# Patient Record
Sex: Female | Born: 1945 | Race: White | Hispanic: No | Smoking: Never smoker
Health system: Southern US, Community
[De-identification: ages and names within clinical notes are randomized; demographics above are authoritative.]

## PROBLEM LIST (undated history)

## (undated) DIAGNOSIS — R7303 Prediabetes: Secondary | ICD-10-CM

## (undated) DIAGNOSIS — M1990 Unspecified osteoarthritis, unspecified site: Secondary | ICD-10-CM

## (undated) DIAGNOSIS — Z22322 Carrier or suspected carrier of Methicillin resistant Staphylococcus aureus: Secondary | ICD-10-CM

## (undated) DIAGNOSIS — I10 Essential (primary) hypertension: Secondary | ICD-10-CM

## (undated) HISTORY — PX: OTHER SURGICAL HISTORY: SHX169

## (undated) HISTORY — PX: ABDOMINAL HYSTERECTOMY: SHX81

## (undated) HISTORY — PX: EYE SURGERY: SHX253

## (undated) HISTORY — PX: CARPAL TUNNEL RELEASE: SHX101

## (undated) HISTORY — PX: APPENDECTOMY: SHX54

---

## 1997-12-12 ENCOUNTER — Ambulatory Visit (HOSPITAL_COMMUNITY): Admission: RE | Admit: 1997-12-12 | Discharge: 1997-12-12 | Payer: Self-pay | Admitting: Family Medicine

## 1998-07-05 ENCOUNTER — Other Ambulatory Visit: Admission: RE | Admit: 1998-07-05 | Discharge: 1998-07-05 | Payer: Self-pay | Admitting: Family Medicine

## 1999-08-13 ENCOUNTER — Other Ambulatory Visit: Admission: RE | Admit: 1999-08-13 | Discharge: 1999-08-13 | Payer: Self-pay | Admitting: Family Medicine

## 2002-04-14 ENCOUNTER — Encounter: Payer: Self-pay | Admitting: Orthopedic Surgery

## 2002-04-21 ENCOUNTER — Encounter: Payer: Self-pay | Admitting: Orthopedic Surgery

## 2002-04-21 ENCOUNTER — Inpatient Hospital Stay (HOSPITAL_COMMUNITY): Admission: RE | Admit: 2002-04-21 | Discharge: 2002-04-26 | Payer: Self-pay | Admitting: Orthopedic Surgery

## 2003-02-07 ENCOUNTER — Encounter: Payer: Self-pay | Admitting: Orthopedic Surgery

## 2003-02-15 ENCOUNTER — Inpatient Hospital Stay (HOSPITAL_COMMUNITY): Admission: RE | Admit: 2003-02-15 | Discharge: 2003-02-20 | Payer: Self-pay | Admitting: Orthopedic Surgery

## 2003-02-15 ENCOUNTER — Encounter: Payer: Self-pay | Admitting: Orthopedic Surgery

## 2003-02-17 ENCOUNTER — Encounter: Payer: Self-pay | Admitting: Orthopedic Surgery

## 2003-02-18 ENCOUNTER — Encounter: Payer: Self-pay | Admitting: Internal Medicine

## 2012-01-02 ENCOUNTER — Ambulatory Visit: Payer: Self-pay

## 2013-10-05 DIAGNOSIS — N390 Urinary tract infection, site not specified: Secondary | ICD-10-CM | POA: Diagnosis not present

## 2013-10-08 DIAGNOSIS — N390 Urinary tract infection, site not specified: Secondary | ICD-10-CM | POA: Diagnosis not present

## 2013-10-11 DIAGNOSIS — M545 Low back pain: Secondary | ICD-10-CM | POA: Diagnosis not present

## 2013-10-20 DIAGNOSIS — M545 Low back pain: Secondary | ICD-10-CM | POA: Diagnosis not present

## 2013-10-25 DIAGNOSIS — M549 Dorsalgia, unspecified: Secondary | ICD-10-CM | POA: Diagnosis not present

## 2013-10-25 DIAGNOSIS — M1990 Unspecified osteoarthritis, unspecified site: Secondary | ICD-10-CM | POA: Diagnosis not present

## 2013-11-09 DIAGNOSIS — H26499 Other secondary cataract, unspecified eye: Secondary | ICD-10-CM | POA: Diagnosis not present

## 2013-11-09 DIAGNOSIS — H2510 Age-related nuclear cataract, unspecified eye: Secondary | ICD-10-CM | POA: Diagnosis not present

## 2013-11-17 DIAGNOSIS — I10 Essential (primary) hypertension: Secondary | ICD-10-CM | POA: Diagnosis not present

## 2013-11-17 DIAGNOSIS — Z1331 Encounter for screening for depression: Secondary | ICD-10-CM | POA: Diagnosis not present

## 2013-11-17 DIAGNOSIS — E786 Lipoprotein deficiency: Secondary | ICD-10-CM | POA: Diagnosis not present

## 2013-11-17 DIAGNOSIS — R7309 Other abnormal glucose: Secondary | ICD-10-CM | POA: Diagnosis not present

## 2013-11-17 DIAGNOSIS — J069 Acute upper respiratory infection, unspecified: Secondary | ICD-10-CM | POA: Diagnosis not present

## 2013-11-26 DIAGNOSIS — H2510 Age-related nuclear cataract, unspecified eye: Secondary | ICD-10-CM | POA: Diagnosis not present

## 2013-11-26 DIAGNOSIS — H269 Unspecified cataract: Secondary | ICD-10-CM | POA: Diagnosis not present

## 2014-01-07 DIAGNOSIS — H269 Unspecified cataract: Secondary | ICD-10-CM | POA: Diagnosis not present

## 2014-01-07 DIAGNOSIS — H2510 Age-related nuclear cataract, unspecified eye: Secondary | ICD-10-CM | POA: Diagnosis not present

## 2014-02-23 DIAGNOSIS — H26499 Other secondary cataract, unspecified eye: Secondary | ICD-10-CM | POA: Diagnosis not present

## 2014-03-03 DIAGNOSIS — H35379 Puckering of macula, unspecified eye: Secondary | ICD-10-CM | POA: Diagnosis not present

## 2014-05-03 DIAGNOSIS — Z961 Presence of intraocular lens: Secondary | ICD-10-CM | POA: Diagnosis not present

## 2014-05-17 DIAGNOSIS — I10 Essential (primary) hypertension: Secondary | ICD-10-CM | POA: Diagnosis not present

## 2014-05-17 DIAGNOSIS — L255 Unspecified contact dermatitis due to plants, except food: Secondary | ICD-10-CM | POA: Diagnosis not present

## 2014-05-17 DIAGNOSIS — R7309 Other abnormal glucose: Secondary | ICD-10-CM | POA: Diagnosis not present

## 2014-07-27 DIAGNOSIS — Z1231 Encounter for screening mammogram for malignant neoplasm of breast: Secondary | ICD-10-CM | POA: Diagnosis not present

## 2014-08-17 DIAGNOSIS — Z23 Encounter for immunization: Secondary | ICD-10-CM | POA: Diagnosis not present

## 2014-08-19 DIAGNOSIS — S61012A Laceration without foreign body of left thumb without damage to nail, initial encounter: Secondary | ICD-10-CM | POA: Diagnosis not present

## 2014-11-03 DIAGNOSIS — M25551 Pain in right hip: Secondary | ICD-10-CM | POA: Diagnosis not present

## 2014-11-03 DIAGNOSIS — Z96641 Presence of right artificial hip joint: Secondary | ICD-10-CM | POA: Diagnosis not present

## 2014-11-04 ENCOUNTER — Encounter (HOSPITAL_COMMUNITY): Payer: Self-pay | Admitting: Emergency Medicine

## 2014-11-04 ENCOUNTER — Emergency Department (HOSPITAL_COMMUNITY)
Admission: EM | Admit: 2014-11-04 | Discharge: 2014-11-04 | Disposition: A | Discharge status: Home / Self Care | Payer: Medicare Other | Attending: Emergency Medicine | Admitting: Emergency Medicine

## 2014-11-04 DIAGNOSIS — E860 Dehydration: Secondary | ICD-10-CM | POA: Diagnosis not present

## 2014-11-04 DIAGNOSIS — D582 Other hemoglobinopathies: Secondary | ICD-10-CM

## 2014-11-04 DIAGNOSIS — Z791 Long term (current) use of non-steroidal anti-inflammatories (NSAID): Secondary | ICD-10-CM | POA: Insufficient documentation

## 2014-11-04 DIAGNOSIS — Z7952 Long term (current) use of systemic steroids: Secondary | ICD-10-CM | POA: Diagnosis not present

## 2014-11-04 DIAGNOSIS — M25552 Pain in left hip: Secondary | ICD-10-CM | POA: Diagnosis not present

## 2014-11-04 DIAGNOSIS — I10 Essential (primary) hypertension: Secondary | ICD-10-CM | POA: Diagnosis not present

## 2014-11-04 DIAGNOSIS — Z79899 Other long term (current) drug therapy: Secondary | ICD-10-CM | POA: Diagnosis not present

## 2014-11-04 DIAGNOSIS — Z7982 Long term (current) use of aspirin: Secondary | ICD-10-CM | POA: Diagnosis not present

## 2014-11-04 DIAGNOSIS — R5383 Other fatigue: Secondary | ICD-10-CM | POA: Diagnosis present

## 2014-11-04 HISTORY — DX: Essential (primary) hypertension: I10

## 2014-11-04 LAB — CBC WITH DIFFERENTIAL/PLATELET
Basophils Absolute: 0 10*3/uL (ref 0.0–0.1)
Basophils Relative: 0 % (ref 0–1)
EOS ABS: 0 10*3/uL (ref 0.0–0.7)
EOS PCT: 0 % (ref 0–5)
HEMATOCRIT: 55.5 % — AB (ref 36.0–46.0)
HEMOGLOBIN: 19.6 g/dL — AB (ref 12.0–15.0)
Lymphocytes Relative: 6 % — ABNORMAL LOW (ref 12–46)
Lymphs Abs: 0.4 10*3/uL — ABNORMAL LOW (ref 0.7–4.0)
MCH: 30.2 pg (ref 26.0–34.0)
MCHC: 35.3 g/dL (ref 30.0–36.0)
MCV: 85.4 fL (ref 78.0–100.0)
Monocytes Absolute: 0.4 10*3/uL (ref 0.1–1.0)
Monocytes Relative: 5 % (ref 3–12)
Neutro Abs: 6 10*3/uL (ref 1.7–7.7)
Neutrophils Relative %: 89 % — ABNORMAL HIGH (ref 43–77)
Platelets: 74 10*3/uL — ABNORMAL LOW (ref 150–400)
RBC: 6.5 MIL/uL — AB (ref 3.87–5.11)
RDW: 13 % (ref 11.5–15.5)
WBC: 6.8 10*3/uL (ref 4.0–10.5)

## 2014-11-04 LAB — COMPREHENSIVE METABOLIC PANEL
ALT: 16 U/L (ref 0–35)
AST: 21 U/L (ref 0–37)
Albumin: 3.6 g/dL (ref 3.5–5.2)
Alkaline Phosphatase: 67 U/L (ref 39–117)
Anion gap: 10 (ref 5–15)
BUN: 16 mg/dL (ref 6–23)
CHLORIDE: 95 meq/L — AB (ref 96–112)
CO2: 28 mmol/L (ref 19–32)
Calcium: 8.7 mg/dL (ref 8.4–10.5)
Creatinine, Ser: 0.61 mg/dL (ref 0.50–1.10)
GFR calc non Af Amer: >90 mL/min (ref >90)
Glucose, Bld: 159 mg/dL — ABNORMAL HIGH (ref 70–99)
Potassium: 3.1 mmol/L — ABNORMAL LOW (ref 3.5–5.1)
Sodium: 133 mmol/L — ABNORMAL LOW (ref 135–145)
Total Bilirubin: 0.9 mg/dL (ref 0.3–1.2)
Total Protein: 7.2 g/dL (ref 6.0–8.3)

## 2014-11-04 LAB — URINALYSIS, ROUTINE W REFLEX MICROSCOPIC
Bilirubin Urine: NEGATIVE
GLUCOSE, UA: NEGATIVE mg/dL
KETONES UR: NEGATIVE mg/dL
Leukocytes, UA: NEGATIVE
Nitrite: NEGATIVE
Protein, ur: 100 mg/dL — AB
Specific Gravity, Urine: 1.021 (ref 1.005–1.030)
Urobilinogen, UA: 1 mg/dL (ref 0.0–1.0)
pH: 6.5 (ref 5.0–8.0)

## 2014-11-04 LAB — LACTIC ACID, PLASMA: LACTIC ACID, VENOUS: 1.6 mmol/L (ref 0.5–2.2)

## 2014-11-04 LAB — URINE MICROSCOPIC-ADD ON

## 2014-11-04 MED ORDER — SODIUM CHLORIDE 0.9 % IV SOLN
Freq: Once | INTRAVENOUS | Status: AC
  Administered 2014-11-04: 16:00:00 via INTRAVENOUS

## 2014-11-04 MED ORDER — HYDROCODONE-ACETAMINOPHEN 5-325 MG PO TABS
2.0000 | ORAL_TABLET | Freq: Once | ORAL | Status: AC
Start: 2014-11-04 — End: 2014-11-04
  Administered 2014-11-04: 2 via ORAL
  Filled 2014-11-04: qty 2

## 2014-11-04 NOTE — ED Provider Notes (Signed)
CSN: [id number]     Arrival date & time 11/04/14  1412 History   First MD Initiated Contact with Patient 11/04/14 1525     Chief Complaint  Patient presents with  . Dehydration  . Fatigue  . Fever     (Consider location/radiation/quality/duration/timing/severity/associated sxs/prior Treatment) Patient is a 69 y.o. female presenting with fever. The history is provided by the patient. No language interpreter was used.  Fever Max temp prior to arrival:  101 Temp source:  Oral Severity:  Moderate Onset quality:  Gradual Duration:  24 hours Timing:  Constant Progression:  Worsening Chronicity:  New Relieved by:  Nothing Worsened by:  Nothing tried Ineffective treatments:  None tried Associated symptoms: no nausea, no sore throat and no vomiting   Risk factors: sick contacts   Pt reports she began having hip pain right side yesterday.   She saw Dr, Darrelyn Hillock who xrayed her hip and said prothesis was fine.  Pt reports she has not felt like drinking and now thinks she is dehydrated, Pt reports today she has bodyaches and has a fever   Past Medical History  Diagnosis Date  . Hypertension    Past Surgical History  Procedure Laterality Date  . Bilateral hip replacment     No family history on file. History  Substance Use Topics  . Smoking status: Not on file  . Smokeless tobacco: Not on file  . Alcohol Use: Not on file   OB History    No data available     Review of Systems  Constitutional: Positive for fever.  HENT: Negative for sore throat.   Gastrointestinal: Negative for nausea and vomiting.  Musculoskeletal: Positive for joint swelling.      Allergies  Oxycodone and Tetracyclines & related  Home Medications   Prior to Admission medications   Medication Sig Start Date End Date Taking? Authorizing Provider  aspirin EC 81 MG tablet Take 81 mg by mouth daily.   Yes Historical Provider, MD  HYDROcodone-acetaminophen (NORCO/VICODIN) 5-325 MG per tablet Take 1 tablet  by mouth every 6 (six) hours as needed for moderate pain.   Yes Historical Provider, MD  lisinopril-hydrochlorothiazide (PRINZIDE,ZESTORETIC) 20-25 MG per tablet Take 1 tablet by mouth every morning.   Yes Historical Provider, MD  metoprolol (LOPRESSOR) 100 MG tablet Take 100 mg by mouth 2 (two) times daily.   Yes Historical Provider, MD  Multiple Vitamin (MULTIVITAMIN WITH MINERALS) TABS tablet Take 1 tablet by mouth daily.   Yes Historical Provider, MD  naproxen sodium (ANAPROX) 220 MG tablet Take 220 mg by mouth 2 (two) times daily with a meal.   Yes Historical Provider, MD  predniSONE (STERAPRED UNI-PAK) 5 MG TABS tablet Take 5-15 mg by mouth daily.   Yes Historical Provider, MD   BP 152/74 mmHg  Pulse 100  Temp(Src) 100 F (37.8 C) (Oral)  Resp 18  Ht 5' 4.5" (1.638 m)  Wt 165 lb (74.844 kg)  BMI 27.90 kg/m2  SpO2 96% Physical Exam  Constitutional: She is oriented to person, place, and time. She appears well-developed and well-nourished.  HENT:  Head: Normocephalic and atraumatic.  Lips dry and cracked  Eyes: EOM are normal. Pupils are equal, round, and reactive to light.  Neck: Normal range of motion.  Cardiovascular: Normal rate and normal heart sounds.   Pulmonary/Chest: Effort normal and breath sounds normal.  Abdominal: Soft. She exhibits no distension. There is tenderness.  Tender right groin area  Musculoskeletal: Normal range of motion.  Neurological: She  is alert and oriented to person, place, and time.  Skin: Skin is warm.  Psychiatric: She has a normal mood and affect.  Nursing note and vitals reviewed.   ED Course  Procedures (including critical care time) Labs Review Labs Reviewed - No data to display  Imaging Review No results found.   EKG Interpretation None      MDM  Pt reports she feels better.  Pt reports feels like she can eat,  Hip is not painful now.   Pt tolerated po fluids and graham crackers.   Pt advised to continue her current medications.   Follow up with Dr. Darrelyn Hillock as scheduled    Final diagnoses:  Hip pain, left  Dehydration  Elevated hemoglobin    Pt counseled on elevated hemoglobin and need for follow up.    Lonia Skinner [place], PA-C 11/04/14 1952  Arby Barrette, MD 11/04/14 [phone]

## 2014-11-04 NOTE — Discharge Instructions (Signed)
Arthralgia °Your caregiver has diagnosed you as suffering from an arthralgia. Arthralgia means there is pain in a joint. This can come from many reasons including: °· Bruising the joint which causes soreness (inflammation) in the joint. °· Wear and tear on the joints which occur as we grow older (osteoarthritis). °· Overusing the joint. °· Various forms of arthritis. °· Infections of the joint. °Regardless of the cause of pain in your joint, most of these different pains respond to anti-inflammatory drugs and rest. The exception to this is when a joint is infected, and these cases are treated with antibiotics, if it is a bacterial infection. °HOME CARE INSTRUCTIONS  °· Rest the injured area for as long as directed by your caregiver. Then slowly start using the joint as directed by your caregiver and as the pain allows. Crutches as directed may be useful if the ankles, knees or hips are involved. If the knee was splinted or casted, continue use and care as directed. If an stretchy or elastic wrapping bandage has been applied today, it should be removed and re-applied every 3 to 4 hours. It should not be applied tightly, but firmly enough to keep swelling down. Watch toes and feet for swelling, bluish discoloration, coldness, numbness or excessive pain. If any of these problems (symptoms) occur, remove the ace bandage and re-apply more loosely. If these symptoms persist, contact your caregiver or return to this location. °· For the first 24 hours, keep the injured extremity elevated on pillows while lying down. °· Apply ice for 15-20 minutes to the sore joint every couple hours while awake for the first half day. Then 03-04 times per day for the first 48 hours. Put the ice in a plastic bag and place a towel between the bag of ice and your skin. °· Wear any splinting, casting, elastic bandage applications, or slings as instructed. °· Only take over-the-counter or prescription medicines for pain, discomfort, or fever as  directed by your caregiver. Do not use aspirin immediately after the injury unless instructed by your physician. Aspirin can cause increased bleeding and bruising of the tissues. °· If you were given crutches, continue to use them as instructed and do not resume weight bearing on the sore joint until instructed. °Persistent pain and inability to use the sore joint as directed for more than 2 to 3 days are warning signs indicating that you should see a caregiver for a follow-up visit as soon as possible. Initially, a hairline fracture (break in bone) may not be evident on X-rays. Persistent pain and swelling indicate that further evaluation, non-weight bearing or use of the joint (use of crutches or slings as instructed), or further X-rays are indicated. X-rays may sometimes not show a small fracture until a week or 10 days later. Make a follow-up appointment with your own caregiver or one to whom we have referred you. A radiologist (specialist in reading X-rays) may read your X-rays. Make sure you know how you are to obtain your X-ray results. Do not assume everything is normal if you do not hear from us. °SEEK MEDICAL CARE IF: °Bruising, swelling, or pain increases. °SEEK IMMEDIATE MEDICAL CARE IF:  °· Your fingers or toes are numb or blue. °· The pain is not responding to medications and continues to stay the same or get worse. °· The pain in your joint becomes severe. °· You develop a fever over 102° F (38.9° C). °· It becomes impossible to move or use the joint. °MAKE SURE YOU:  °·   Understand these instructions.  Will watch your condition.  Will get help right away if you are not doing well or get worse. Document Released: 10/21/2005 Document Revised: 01/13/2012 Document Reviewed: 06/08/2008 [facility] Patient Information 2015 [place], [place]. This information is not intended to replace advice given to you by your health care provider. Make sure you discuss any questions you have with your health care  provider. Dehydration, Adult Dehydration is when you lose more fluids from the body than you take in. Vital organs like the kidneys, brain, and heart cannot function without a proper amount of fluids and salt. Any loss of fluids from the body can cause dehydration.  CAUSES   Vomiting.  Diarrhea.  Excessive sweating.  Excessive urine output.  Fever. SYMPTOMS  Mild dehydration  Thirst.  Dry lips.  Slightly dry mouth. Moderate dehydration  Very dry mouth.  Sunken eyes.  Skin does not bounce back quickly when lightly pinched and released.  Dark urine and decreased urine production.  Decreased tear production.  Headache. Severe dehydration  Very dry mouth.  Extreme thirst.  Rapid, weak pulse (more than 100 beats per minute at rest).  Cold hands and feet.  Not able to sweat in spite of heat and temperature.  Rapid breathing.  Blue lips.  Confusion and lethargy.  Difficulty being awakened.  Minimal urine production.  No tears. DIAGNOSIS  Your caregiver will diagnose dehydration based on your symptoms and your exam. Blood and urine tests will help confirm the diagnosis. The diagnostic evaluation should also identify the cause of dehydration. TREATMENT  Treatment of mild or moderate dehydration can often be done at home by increasing the amount of fluids that you drink. It is best to drink small amounts of fluid more often. Drinking too much at one time can make vomiting worse. Refer to the home care instructions below. Severe dehydration needs to be treated at the hospital where you will probably be given intravenous (IV) fluids that contain water and electrolytes. HOME CARE INSTRUCTIONS   Ask your caregiver about specific rehydration instructions.  Drink enough fluids to keep your urine clear or pale yellow.  Drink small amounts frequently if you have nausea and vomiting.  Eat as you normally do.  Avoid:  Foods or drinks high in sugar.  Carbonated  drinks.  Juice.  Extremely hot or cold fluids.  Drinks with caffeine.  Fatty, greasy foods.  Alcohol.  Tobacco.  Overeating.  Gelatin desserts.  Wash your hands well to avoid spreading bacteria and viruses.  Only take over-the-counter or prescription medicines for pain, discomfort, or fever as directed by your caregiver.  Ask your caregiver if you should continue all prescribed and over-the-counter medicines.  Keep all follow-up appointments with your caregiver. SEEK MEDICAL CARE IF:  You have abdominal pain and it increases or stays in one area (localizes).  You have a rash, stiff neck, or severe headache.  You are irritable, sleepy, or difficult to awaken.  You are weak, dizzy, or extremely thirsty. SEEK IMMEDIATE MEDICAL CARE IF:   You are unable to keep fluids down or you get worse despite treatment.  You have frequent episodes of vomiting or diarrhea.  You have blood or green matter (bile) in your vomit.  You have blood in your stool or your stool looks black and tarry.  You have not urinated in 6 to 8 hours, or you have only urinated a small amount of very dark urine.  You have a fever.  You faint. MAKE SURE YOU:  Understand these instructions.  Will watch your condition.  Will get help right away if you are not doing well or get worse. Document Released: 10/21/2005 Document Revised: 01/13/2012 Document Reviewed: 06/10/2011 [facility] Patient Information 2015 [place], [place]. This information is not intended to replace advice given to you by your health care provider. Make sure you discuss any questions you have with your health care provider.

## 2014-11-04 NOTE — ED Notes (Signed)
Pt states she's feeling much better. Gave peanut butter, graham crackers and water. Husband at bedside, will check to make sure pt tolerating PO intake momentarily

## 2014-11-04 NOTE — ED Notes (Signed)
Pt c/o bilateral hip pain and states she saw her ortho doctor yesterday and was prescribed prednisone. Pt now c/o fatigue, fever, and dehydration.

## 2014-11-11 DIAGNOSIS — M25551 Pain in right hip: Secondary | ICD-10-CM | POA: Diagnosis not present

## 2014-11-11 DIAGNOSIS — Z96641 Presence of right artificial hip joint: Secondary | ICD-10-CM | POA: Diagnosis not present

## 2014-11-18 DIAGNOSIS — I10 Essential (primary) hypertension: Secondary | ICD-10-CM | POA: Diagnosis not present

## 2014-11-18 DIAGNOSIS — E782 Mixed hyperlipidemia: Secondary | ICD-10-CM | POA: Diagnosis not present

## 2014-11-18 DIAGNOSIS — R7309 Other abnormal glucose: Secondary | ICD-10-CM | POA: Diagnosis not present

## 2014-11-18 DIAGNOSIS — Z1389 Encounter for screening for other disorder: Secondary | ICD-10-CM | POA: Diagnosis not present

## 2015-07-05 DIAGNOSIS — I10 Essential (primary) hypertension: Secondary | ICD-10-CM | POA: Diagnosis not present

## 2015-07-05 DIAGNOSIS — E782 Mixed hyperlipidemia: Secondary | ICD-10-CM | POA: Diagnosis not present

## 2015-07-05 DIAGNOSIS — R7309 Other abnormal glucose: Secondary | ICD-10-CM | POA: Diagnosis not present

## 2015-08-03 DIAGNOSIS — Z23 Encounter for immunization: Secondary | ICD-10-CM | POA: Diagnosis not present

## 2015-08-04 DIAGNOSIS — Z1231 Encounter for screening mammogram for malignant neoplasm of breast: Secondary | ICD-10-CM | POA: Diagnosis not present

## 2015-08-15 DIAGNOSIS — Z23 Encounter for immunization: Secondary | ICD-10-CM | POA: Diagnosis not present

## 2015-10-12 ENCOUNTER — Emergency Department (HOSPITAL_COMMUNITY): Payer: Medicare Other

## 2015-10-12 ENCOUNTER — Encounter (HOSPITAL_COMMUNITY): Payer: Self-pay | Admitting: Emergency Medicine

## 2015-10-12 ENCOUNTER — Emergency Department (HOSPITAL_COMMUNITY)
Admission: EM | Admit: 2015-10-12 | Discharge: 2015-10-12 | Disposition: A | Discharge status: Home / Self Care | Payer: Medicare Other | Attending: Emergency Medicine | Admitting: Emergency Medicine

## 2015-10-12 DIAGNOSIS — S0003XA Contusion of scalp, initial encounter: Secondary | ICD-10-CM | POA: Diagnosis not present

## 2015-10-12 DIAGNOSIS — Y9389 Activity, other specified: Secondary | ICD-10-CM | POA: Insufficient documentation

## 2015-10-12 DIAGNOSIS — S0990XA Unspecified injury of head, initial encounter: Secondary | ICD-10-CM | POA: Diagnosis not present

## 2015-10-12 DIAGNOSIS — W010XXA Fall on same level from slipping, tripping and stumbling without subsequent striking against object, initial encounter: Secondary | ICD-10-CM | POA: Insufficient documentation

## 2015-10-12 DIAGNOSIS — S93402A Sprain of unspecified ligament of left ankle, initial encounter: Secondary | ICD-10-CM | POA: Diagnosis not present

## 2015-10-12 DIAGNOSIS — Y998 Other external cause status: Secondary | ICD-10-CM | POA: Insufficient documentation

## 2015-10-12 DIAGNOSIS — S0093XA Contusion of unspecified part of head, initial encounter: Secondary | ICD-10-CM | POA: Diagnosis not present

## 2015-10-12 DIAGNOSIS — Y9289 Other specified places as the place of occurrence of the external cause: Secondary | ICD-10-CM | POA: Insufficient documentation

## 2015-10-12 DIAGNOSIS — S0081XA Abrasion of other part of head, initial encounter: Secondary | ICD-10-CM | POA: Insufficient documentation

## 2015-10-12 DIAGNOSIS — I10 Essential (primary) hypertension: Secondary | ICD-10-CM | POA: Diagnosis not present

## 2015-10-12 DIAGNOSIS — S99912A Unspecified injury of left ankle, initial encounter: Secondary | ICD-10-CM | POA: Diagnosis present

## 2015-10-12 DIAGNOSIS — W19XXXA Unspecified fall, initial encounter: Secondary | ICD-10-CM

## 2015-10-12 DIAGNOSIS — Z7982 Long term (current) use of aspirin: Secondary | ICD-10-CM | POA: Insufficient documentation

## 2015-10-12 DIAGNOSIS — Z79899 Other long term (current) drug therapy: Secondary | ICD-10-CM | POA: Diagnosis not present

## 2015-10-12 DIAGNOSIS — M25572 Pain in left ankle and joints of left foot: Secondary | ICD-10-CM | POA: Diagnosis not present

## 2015-10-12 MED ORDER — BACITRACIN-NEOMYCIN-POLYMYXIN OINTMENT TUBE
TOPICAL_OINTMENT | Freq: Every day | CUTANEOUS | Status: DC
  Filled 2015-10-12: qty 15

## 2015-10-12 MED ORDER — BACITRACIN ZINC 500 UNIT/GM EX OINT
TOPICAL_OINTMENT | CUTANEOUS | Status: AC
  Administered 2015-10-12: 3
  Filled 2015-10-12: qty 2.7

## 2015-10-12 NOTE — ED Notes (Signed)
Bed: WA16 Expected date:  Expected time:  Means of arrival:  Comments: EMS/FALL 

## 2015-10-12 NOTE — ED Provider Notes (Signed)
CSN: [id number]     Arrival date & time 10/12/15  1451 History   First MD Initiated Contact with Patient 10/12/15 1516     Chief Complaint  Patient presents with  . Fall      HPI Pt tripped and fell today, landed on right knee, hyper-everted left ankle, left forehead. Left ankle edematous and ecchymotic. No LOC. Sensation and motor function intact. Pt denies any sensations or sounds of popping. Full ROM to right knee, crepitus present, pt states this is normal. Past Medical History  Diagnosis Date  . Hypertension    Past Surgical History  Procedure Laterality Date  . Bilateral hip replacment    . Abdominal hysterectomy     History reviewed. No pertinent family history. Social History  Substance Use Topics  . Smoking status: None  . Smokeless tobacco: None  . Alcohol Use: None   OB History    No data available     Review of Systems  Neurological: Negative for syncope.  All other systems reviewed and are negative.     Allergies  Oxycodone and Tetracyclines & related  Home Medications   Prior to Admission medications   Medication Sig Start Date End Date Taking? Authorizing Provider  aspirin EC 325 MG tablet Take 325 mg by mouth daily.   Yes Historical Provider, MD  calcium carbonate (TUMS - DOSED IN MG ELEMENTAL CALCIUM) 500 MG chewable tablet Chew 1 tablet by mouth daily.   Yes Historical Provider, MD  Cinnamon 500 MG capsule Take 1,000 mg by mouth 2 (two) times daily.   Yes Historical Provider, MD  FLUZONE HIGH-DOSE 0.5 ML SUSY Inject 1 Dose as directed once. 08/15/15  Yes Historical Provider, MD  guaiFENesin (MUCINEX) 600 MG 12 hr tablet Take 600 mg by mouth daily as needed for cough.   Yes Historical Provider, MD  HYDROcodone-acetaminophen (NORCO/VICODIN) 5-325 MG per tablet Take 1 tablet by mouth every 6 (six) hours as needed for moderate pain.   Yes Historical Provider, MD  lisinopril-hydrochlorothiazide (PRINZIDE,ZESTORETIC) 20-25 MG per tablet Take 1 tablet by  mouth every morning.   Yes Historical Provider, MD  metoprolol (LOPRESSOR) 100 MG tablet Take 100 mg by mouth 2 (two) times daily.   Yes Historical Provider, MD  Multiple Vitamin (MULTIVITAMIN WITH MINERALS) TABS tablet Take 1 tablet by mouth daily.   Yes Historical Provider, MD  naproxen sodium (ANAPROX) 220 MG tablet Take 220 mg by mouth daily as needed (aches).    Yes Historical Provider, MD  PREVNAR 13 SUSP injection Inject 1 Dose as directed once. 08/03/15  Yes Historical Provider, MD  Turmeric 500 MG CAPS Take 1 capsule by mouth 2 (two) times daily.   Yes Historical Provider, MD   BP 193/99 mmHg  Pulse 84  Temp(Src) 98.8 F (37.1 C) (Oral)  Resp 18  SpO2 99% Physical Exam  Constitutional: She is oriented to person, place, and time. She appears well-developed and well-nourished. No distress.  HENT:  Head: Normocephalic.    Eyes: Pupils are equal, round, and reactive to light.  Neck: Normal range of motion.  Cardiovascular: Normal rate and intact distal pulses.   Pulmonary/Chest: No respiratory distress.  Abdominal: Normal appearance. She exhibits no distension.  Musculoskeletal: Normal range of motion.       Right knee: She exhibits normal range of motion, no swelling and no effusion. No tenderness found.       Left ankle: She exhibits swelling. She exhibits normal range of motion, no ecchymosis, no deformity  and normal pulse. No tenderness.       Legs: Neurological: She is alert and oriented to person, place, and time. No cranial nerve deficit.  Skin: Skin is warm and dry. No rash noted.  Psychiatric: She has a normal mood and affect. Her behavior is normal.  Nursing note and vitals reviewed.   ED Course  Procedures (including critical care time) Labs Review Labs Reviewed - No data to display  Imaging Review Dg Ankle Complete Left  10/12/2015  CLINICAL DATA:  Left ankle pain following fall down stairs this afternoon, initial encounter EXAM: LEFT ANKLE COMPLETE - 3+ VIEW  COMPARISON:  None. FINDINGS: Mild soft tissue swelling is noted over the dorsal aspect of the foot. Some tarsal degenerative changes are seen. A well corticated bony density is noted adjacent to the lateral malleolus consistent with prior trauma. No acute bony abnormality is seen. IMPRESSION: Degenerative change without acute bony abnormality. Electronically Signed   By: Alcide Clever M.D.   On: 10/12/2015 15:32   Ct Head Wo Contrast  10/12/2015  CLINICAL DATA:  Recent fall today with left forehead injury EXAM: CT HEAD WITHOUT CONTRAST TECHNIQUE: Contiguous axial images were obtained from the base of the skull through the vertex without intravenous contrast. COMPARISON:  None. FINDINGS: Focal soft tissue hematoma is noted in the left forehead. No underlying bony abnormality is seen. No findings to suggest acute hemorrhage, acute infarction or space-occupying mass lesion are noted. IMPRESSION: Right frontal scalp hematoma. No acute intracranial abnormality noted. Electronically Signed   By: Alcide Clever M.D.   On: 10/12/2015 15:54   I have personally reviewed and evaluated these images and lab results as part of my medical decision-making.   EKG Interpretation None      MDM   Final diagnoses:  Fall, initial encounter  Facial abrasion, initial encounter  Ankle sprain, left, initial encounter        Nelva Nay, MD 10/12/15 1626

## 2015-10-12 NOTE — Progress Notes (Signed)
CSW met with patient at bedside. Husband was present. Patient presents to [facility] due to fall. Patient lives in [place] with her husband.  Patient confirms that she presents to [facility] due to falling. Patient states that she fell while at the [facility]. Patient stated " I stepped off the curb and it rolled". Patient says that she may have stepped on a rock.  Patient denies falling often. Patient says that she has not had any falling incidents. Patient informed CSW that she has been able to complete all ADL's independently. Also, she states that she is not interested in a facility.   Husband/Ed Debes [phone]  Willette Brace [phone] ED CSW 10/12/2015 4:38 PM

## 2015-10-12 NOTE — ED Notes (Signed)
Pt tripped and fell today, landed on right knee, hyper-everted left ankle, left forehead. Left ankle edematous and ecchymotic. No LOC. Sensation and motor function intact. Pt denies any sensations or sounds of popping. Full ROM to right knee, crepitus present, pt states this is normal.

## 2015-10-12 NOTE — Discharge Instructions (Signed)
Abrasion An abrasion is a cut or scrape on the outer surface of your skin. An abrasion does not extend through all of the layers of your skin. It is important to care for your abrasion properly to prevent infection. CAUSES Most abrasions are caused by falling on or gliding across the ground or another surface. When your skin rubs on something, the outer and inner layer of skin rubs off.  SYMPTOMS A cut or scrape is the main symptom of this condition. The scrape may be bleeding, or it may appear red or pink. If there was an associated fall, there may be an underlying bruise. DIAGNOSIS An abrasion is diagnosed with a physical exam. TREATMENT Treatment for this condition depends on how large and deep the abrasion is. Usually, your abrasion will be cleaned with water and mild soap. This removes any dirt or debris that may be stuck. An antibiotic ointment may be applied to the abrasion to help prevent infection. A bandage (dressing) may be placed on the abrasion to keep it clean. You may also need a tetanus shot. HOME CARE INSTRUCTIONS Medicines  Take or apply medicines only as directed by your health care provider.  If you were prescribed an antibiotic ointment, finish all of it even if you start to feel better. Wound Care  Clean the wound with mild soap and water 2-3 times per day or as directed by your health care provider. Pat your wound dry with a clean towel. Do not rub it.  There are many different ways to close and cover a wound. Follow instructions from your health care provider about:  Wound care.  Dressing changes and removal.  Check your wound every day for signs of infection. Watch for:  Redness, swelling, or pain.  Fluid, blood, or pus. General Instructions  Keep the dressing dry as directed by your health care provider. Do not take baths, swim, use a hot tub, or do anything that would put your wound underwater until your health care provider approves.  If there is  swelling, raise (elevate) the injured area above the level of your heart while you are sitting or lying down.  Keep all follow-up visits as directed by your health care provider. This is important. SEEK MEDICAL CARE IF:  You received a tetanus shot and you have swelling, severe pain, redness, or bleeding at the injection site.  Your pain is not controlled with medicine.  You have increased redness, swelling, or pain at the site of your wound. SEEK IMMEDIATE MEDICAL CARE IF:  You have a red streak going away from your wound.  You have a fever.  You have fluid, blood, or pus coming from your wound.  You notice a bad smell coming from your wound or your dressing.   This information is not intended to replace advice given to you by your health care provider. Make sure you discuss any questions you have with your health care provider.   Document Released: 07/31/2005 Document Revised: 07/12/2015 Document Reviewed: 10/19/2014 Elsevier Interactive Patient Education 2016 [employer]  Ankle Sprain An ankle sprain is an injury to the strong, fibrous tissues (ligaments) that hold your ankle bones together.  HOME CARE   Put ice on your ankle for 1-2 days or as told by your doctor.  Put ice in a plastic bag.  Place a towel between your skin and the bag.  Leave the ice on for 15-20 minutes at a time, every 2 hours while you are awake.  Only take  medicine as told by your doctor.  Raise (elevate) your injured ankle above the level of your heart as much as possible for 2-3 days.  Use crutches if your doctor tells you to. Slowly put your own weight on the affected ankle. Use the crutches until you can walk without pain.  If you have a plaster splint:  Do not rest it on anything harder than a pillow for 24 hours.  Do not put weight on it.  Do not get it wet.  Take it off to shower or bathe.  If given, use an elastic wrap or support stocking for support. Take the wrap off if your  toes lose feeling (numb), tingle, or turn cold or blue.  If you have an air splint:  Add or let out air to make it comfortable.  Take it off at night and to shower and bathe.  Wiggle your toes and move your ankle up and down often while you are wearing it. GET HELP IF:  You have rapidly increasing bruising or puffiness (swelling).  Your toes feel very cold.  You lose feeling in your foot.  Your medicine does not help your pain. GET HELP RIGHT AWAY IF:   Your toes lose feeling (numb) or turn blue.  You have severe pain that is increasing. MAKE SURE YOU:   Understand these instructions.  Will watch your condition.  Will get help right away if you are not doing well or get worse.   This information is not intended to replace advice given to you by your health care provider. Make sure you discuss any questions you have with your health care provider.   Document Released: 04/08/2008 Document Revised: 11/11/2014 Document Reviewed: 05/04/2012 Elsevier Interactive Patient Education 2016 [employer]  Acute Ankle Sprain With Phase I Rehab An acute ankle sprain is a partial or complete tear in one or more of the ligaments of the ankle due to traumatic injury. The severity of the injury depends on both the number of ligaments sprained and the grade of sprain. There are 3 grades of sprains.   A grade 1 sprain is a mild sprain. There is a slight pull without obvious tearing. There is no loss of strength, and the muscle and ligament are the correct length.  A grade 2 sprain is a moderate sprain. There is tearing of fibers within the substance of the ligament where it connects two bones or two cartilages. The length of the ligament is increased, and there is usually decreased strength.  A grade 3 sprain is a complete rupture of the ligament and is uncommon. In addition to the grade of sprain, there are three types of ankle sprains.  Lateral ankle sprains: This is a sprain of one or  more of the three ligaments on the outer side (lateral) of the ankle. These are the most common sprains. Medial ankle sprains: There is one large triangular ligament of the inner side (medial) of the ankle that is susceptible to injury. Medial ankle sprains are less common. Syndesmosis, "high ankle," sprains: The syndesmosis is the ligament that connects the two bones of the lower leg. Syndesmosis sprains usually only occur with very severe ankle sprains. SYMPTOMS  Pain, tenderness, and swelling in the ankle, starting at the side of injury that may progress to the whole ankle and foot with time.  "Pop" or tearing sensation at the time of injury.  Bruising that may spread to the heel.  Impaired ability to walk soon after injury. CAUSES  Acute ankle sprains are caused by trauma placed on the ankle that temporarily forces or pries the anklebone (talus) out of its normal socket.  Stretching or tearing of the ligaments that normally hold the joint in place (usually due to a twisting injury). RISK INCREASES WITH:  Previous ankle sprain.  Sports in which the foot may land awkwardly (i.e., basketball, volleyball, or soccer) or walking or running on uneven or rough surfaces.  Shoes with inadequate support to prevent sideways motion when stress occurs.  Poor strength and flexibility.  Poor balance skills.  Contact sports. PREVENTION   Warm up and stretch properly before activity.  Maintain physical fitness:  Ankle and leg flexibility, muscle strength, and endurance.  Cardiovascular fitness.  Balance training activities.  Use proper technique and have a coach correct improper technique.  Taping, protective strapping, bracing, or high-top tennis shoes may help prevent injury. Initially, tape is best; however, it loses most of its support function within 10 to 15 minutes.  Wear proper-fitted protective shoes (High-top shoes with taping or bracing is more effective than either  alone).  Provide the ankle with support during sports and practice activities for 12 months following injury. PROGNOSIS   If treated properly, ankle sprains can be expected to recover completely; however, the length of recovery depends on the degree of injury.  A grade 1 sprain usually heals enough in 5 to 7 days to allow modified activity and requires an average of 6 weeks to heal completely.  A grade 2 sprain requires 6 to 10 weeks to heal completely.  A grade 3 sprain requires 12 to 16 weeks to heal.  A syndesmosis sprain often takes more than 3 months to heal. RELATED COMPLICATIONS   Frequent recurrence of symptoms may result in a chronic problem. Appropriately addressing the problem the first time decreases the frequency of recurrence and optimizes healing time. Severity of the initial sprain does not predict the likelihood of later instability.  Injury to other structures (bone, cartilage, or tendon).  A chronically unstable or arthritic ankle joint is a possibility with repeated sprains. TREATMENT Treatment initially involves the use of ice, medication, and compression bandages to help reduce pain and inflammation. Ankle sprains are usually immobilized in a walking cast or boot to allow for healing. Crutches may be recommended to reduce pressure on the injury. After immobilization, strengthening and stretching exercises may be necessary to regain strength and a full range of motion. Surgery is rarely needed to treat ankle sprains. MEDICATION   Nonsteroidal anti-inflammatory medications, such as aspirin and ibuprofen (do not take for the first 3 days after injury or within 7 days before surgery), or other minor pain relievers, such as acetaminophen, are often recommended. Take these as directed by your caregiver. Contact your caregiver immediately if any bleeding, stomach upset, or signs of an allergic reaction occur from these medications.  Ointments applied to the skin may be  helpful.  Pain relievers may be prescribed as necessary by your caregiver. Do not take prescription pain medication for longer than 4 to 7 days. Use only as directed and only as much as you need. HEAT AND COLD  Cold treatment (icing) is used to relieve pain and reduce inflammation for acute and chronic cases. Cold should be applied for 10 to 15 minutes every 2 to 3 hours for inflammation and pain and immediately after any activity that aggravates your symptoms. Use ice packs or an ice massage.  Heat treatment may be used before performing stretching and  strengthening activities prescribed by your caregiver. Use a heat pack or a warm soak. SEEK IMMEDIATE MEDICAL CARE IF:   Pain, swelling, or bruising worsens despite treatment.  You experience pain, numbness, discoloration, or coldness in the foot or toes.  New, unexplained symptoms develop (drugs used in treatment may produce side effects.) EXERCISES  PHASE I EXERCISES RANGE OF MOTION (ROM) AND STRETCHING EXERCISES - Ankle Sprain, Acute Phase I, Weeks 1 to 2 These exercises may help you when beginning to restore flexibility in your ankle. You will likely work on these exercises for the 1 to 2 weeks after your injury. Once your physician, physical therapist, or athletic trainer sees adequate progress, he or she will advance your exercises. While completing these exercises, remember:   Restoring tissue flexibility helps normal motion to return to the joints. This allows healthier, less painful movement and activity.  An effective stretch should be held for at least 30 seconds.  A stretch should never be painful. You should only feel a gentle lengthening or release in the stretched tissue. RANGE OF MOTION - Dorsi/Plantar Flexion  While sitting with your right / left knee straight, draw the top of your foot upwards by flexing your ankle. Then reverse the motion, pointing your toes downward.  Hold each position for __________ seconds.  After  completing your first set of exercises, repeat this exercise with your knee bent. Repeat __________ times. Complete this exercise __________ times per day.  RANGE OF MOTION - Ankle Alphabet  Imagine your right / left big toe is a pen.  Keeping your hip and knee still, write out the entire alphabet with your "pen." Make the letters as large as you can without increasing any discomfort. Repeat __________ times. Complete this exercise __________ times per day.  STRENGTHENING EXERCISES - Ankle Sprain, Acute -Phase I, Weeks 1 to 2 These exercises may help you when beginning to restore strength in your ankle. You will likely work on these exercises for 1 to 2 weeks after your injury. Once your physician, physical therapist, or athletic trainer sees adequate progress, he or she will advance your exercises. While completing these exercises, remember:   Muscles can gain both the endurance and the strength needed for everyday activities through controlled exercises.  Complete these exercises as instructed by your physician, physical therapist, or athletic trainer. Progress the resistance and repetitions only as guided.  You may experience muscle soreness or fatigue, but the pain or discomfort you are trying to eliminate should never worsen during these exercises. If this pain does worsen, stop and make certain you are following the directions exactly. If the pain is still present after adjustments, discontinue the exercise until you can discuss the trouble with your clinician. STRENGTH - Dorsiflexors  Secure a rubber exercise band/tubing to a fixed object (i.e., table, pole) and loop the other end around your right / left foot.  Sit on the floor facing the fixed object. The band/tubing should be slightly tense when your foot is relaxed.  Slowly draw your foot back toward you using your ankle and toes.  Hold this position for __________ seconds. Slowly release the tension in the band and return your  foot to the starting position. Repeat __________ times. Complete this exercise __________ times per day.  STRENGTH - Plantar-flexors   Sit with your right / left leg extended. Holding onto both ends of a rubber exercise band/tubing, loop it around the ball of your foot. Keep a slight tension in the band.  Slowly  push your toes away from you, pointing them downward.  Hold this position for __________ seconds. Return slowly, controlling the tension in the band/tubing. Repeat __________ times. Complete this exercise __________ times per day.  STRENGTH - Ankle Eversion  Secure one end of a rubber exercise band/tubing to a fixed object (table, pole). Loop the other end around your foot just before your toes.  Place your fists between your knees. This will focus your strengthening at your ankle.  Drawing the band/tubing across your opposite foot, slowly, pull your little toe out and up. Make sure the band/tubing is positioned to resist the entire motion.  Hold this position for __________ seconds. Have your muscles resist the band/tubing as it slowly pulls your foot back to the starting position.  Repeat __________ times. Complete this exercise __________ times per day.  STRENGTH - Ankle Inversion  Secure one end of a rubber exercise band/tubing to a fixed object (table, pole). Loop the other end around your foot just before your toes.  Place your fists between your knees. This will focus your strengthening at your ankle.  Slowly, pull your big toe up and in, making sure the band/tubing is positioned to resist the entire motion.  Hold this position for __________ seconds.  Have your muscles resist the band/tubing as it slowly pulls your foot back to the starting position. Repeat __________ times. Complete this exercises __________ times per day.  STRENGTH - Towel Curls  Sit in a chair positioned on a non-carpeted surface.  Place your right / left foot on a towel, keeping your heel on  the floor.  Pull the towel toward your heel by only curling your toes. Keep your heel on the floor.  If instructed by your physician, physical therapist, or athletic trainer, add weight to the end of the towel. Repeat __________ times. Complete this exercise __________ times per day.   This information is not intended to replace advice given to you by your health care provider. Make sure you discuss any questions you have with your health care provider.   Document Released: 05/22/2005 Document Revised: 11/11/2014 Document Reviewed: 02/02/2009 Elsevier Interactive Patient Education [employer]

## 2016-02-13 DIAGNOSIS — E782 Mixed hyperlipidemia: Secondary | ICD-10-CM | POA: Diagnosis not present

## 2016-02-13 DIAGNOSIS — I10 Essential (primary) hypertension: Secondary | ICD-10-CM | POA: Diagnosis not present

## 2016-02-13 DIAGNOSIS — Z1389 Encounter for screening for other disorder: Secondary | ICD-10-CM | POA: Diagnosis not present

## 2016-02-13 DIAGNOSIS — R7309 Other abnormal glucose: Secondary | ICD-10-CM | POA: Diagnosis not present

## 2016-02-13 DIAGNOSIS — R7303 Prediabetes: Secondary | ICD-10-CM | POA: Diagnosis not present

## 2016-05-15 DIAGNOSIS — H35362 Drusen (degenerative) of macula, left eye: Secondary | ICD-10-CM | POA: Diagnosis not present

## 2016-05-15 DIAGNOSIS — H43393 Other vitreous opacities, bilateral: Secondary | ICD-10-CM | POA: Diagnosis not present

## 2016-08-06 DIAGNOSIS — Z23 Encounter for immunization: Secondary | ICD-10-CM | POA: Diagnosis not present

## 2016-08-14 DIAGNOSIS — I10 Essential (primary) hypertension: Secondary | ICD-10-CM | POA: Diagnosis not present

## 2016-08-14 DIAGNOSIS — Z1211 Encounter for screening for malignant neoplasm of colon: Secondary | ICD-10-CM | POA: Diagnosis not present

## 2016-08-14 DIAGNOSIS — Z0000 Encounter for general adult medical examination without abnormal findings: Secondary | ICD-10-CM | POA: Diagnosis not present

## 2016-08-14 DIAGNOSIS — Z1389 Encounter for screening for other disorder: Secondary | ICD-10-CM | POA: Diagnosis not present

## 2016-08-27 ENCOUNTER — Emergency Department (HOSPITAL_COMMUNITY): Payer: Medicare Other

## 2016-08-27 ENCOUNTER — Encounter (HOSPITAL_COMMUNITY): Payer: Self-pay

## 2016-08-27 ENCOUNTER — Emergency Department (HOSPITAL_COMMUNITY)
Admission: EM | Admit: 2016-08-27 | Discharge: 2016-08-27 | Disposition: A | Discharge status: Home / Self Care | Payer: Medicare Other | Attending: Emergency Medicine | Admitting: Emergency Medicine

## 2016-08-27 DIAGNOSIS — W109XXA Fall (on) (from) unspecified stairs and steps, initial encounter: Secondary | ICD-10-CM | POA: Diagnosis not present

## 2016-08-27 DIAGNOSIS — Z79899 Other long term (current) drug therapy: Secondary | ICD-10-CM | POA: Diagnosis not present

## 2016-08-27 DIAGNOSIS — R259 Unspecified abnormal involuntary movements: Secondary | ICD-10-CM | POA: Diagnosis not present

## 2016-08-27 DIAGNOSIS — I10 Essential (primary) hypertension: Secondary | ICD-10-CM | POA: Insufficient documentation

## 2016-08-27 DIAGNOSIS — Y999 Unspecified external cause status: Secondary | ICD-10-CM | POA: Insufficient documentation

## 2016-08-27 DIAGNOSIS — Z7982 Long term (current) use of aspirin: Secondary | ICD-10-CM | POA: Diagnosis not present

## 2016-08-27 DIAGNOSIS — Y929 Unspecified place or not applicable: Secondary | ICD-10-CM | POA: Insufficient documentation

## 2016-08-27 DIAGNOSIS — M25519 Pain in unspecified shoulder: Secondary | ICD-10-CM | POA: Diagnosis not present

## 2016-08-27 DIAGNOSIS — Y939 Activity, unspecified: Secondary | ICD-10-CM | POA: Insufficient documentation

## 2016-08-27 DIAGNOSIS — S42252A Displaced fracture of greater tuberosity of left humerus, initial encounter for closed fracture: Secondary | ICD-10-CM | POA: Diagnosis not present

## 2016-08-27 DIAGNOSIS — S42212A Unspecified displaced fracture of surgical neck of left humerus, initial encounter for closed fracture: Secondary | ICD-10-CM

## 2016-08-27 DIAGNOSIS — S4992XA Unspecified injury of left shoulder and upper arm, initial encounter: Secondary | ICD-10-CM | POA: Diagnosis present

## 2016-08-27 MED ORDER — HYDROCODONE-ACETAMINOPHEN 5-325 MG PO TABS
1.0000 | ORAL_TABLET | Freq: Once | ORAL | Status: AC
  Administered 2016-08-27: 1 via ORAL
  Filled 2016-08-27: qty 1

## 2016-08-27 MED ORDER — HYDROCODONE-ACETAMINOPHEN 5-325 MG PO TABS: 1.0000 | ORAL_TABLET | ORAL | 0 refills | Status: DC | PRN

## 2016-08-27 MED ORDER — IBUPROFEN 200 MG PO TABS
600.0000 mg | ORAL_TABLET | Freq: Once | ORAL | Status: AC
  Administered 2016-08-27: 600 mg via ORAL
  Filled 2016-08-27: qty 3

## 2016-08-27 MED ORDER — CYCLOBENZAPRINE HCL 10 MG PO TABS: 5.0000 mg | ORAL_TABLET | Freq: Once | ORAL | Status: DC

## 2016-08-27 NOTE — Discharge Instructions (Signed)
Read the information below.  Use the prescribed medication as directed.  Please discuss all new medications with your pharmacist.  Do not take additional tylenol while taking the prescribed pain medication to avoid overdose.  You may return to the Emergency Department at any time for worsening condition or any new symptoms that concern you.     If you develop uncontrolled pain, weakness or numbness of the extremity, severe discoloration of the skin, or you are unable to move your hand, return to the ER for a recheck.    °

## 2016-08-27 NOTE — ED Provider Notes (Signed)
Medical screening examination/treatment/procedure(s) were conducted as a shared visit with non-physician practitioner(s) and myself.  I personally evaluated the patient during the encounter.   EKG Interpretation None     69yF with L shoulder pain. Closed humeral head fx. NVI. Sling. PRN pain meds. [facility] Out[facility] FU. It has been determined that no acute conditions requiring further emergency intervention are present at this time. The patient has been advised of the diagnosis and plan. I reviewed any labs and imaging including any potential incidental findings. We have discussed signs and symptoms that warrant return to the ED and they are listed in the discharge instructions.     Raeford RazorStephen , MD 08/27/16 2023

## 2016-08-27 NOTE — ED Notes (Signed)
PA at bedside.

## 2016-08-27 NOTE — ED Triage Notes (Signed)
Per PTAR, Pt c/o L shoulder pain r/t a trip and fall this afternoon.  Pain score 6/10.  PTAR provided an arm sling and ice pack.  PTAR reports Pt tripped and fell on a step.  Limited ROM noted.

## 2016-08-27 NOTE — ED Notes (Signed)
Patient transported to X-ray 

## 2016-08-27 NOTE — ED Provider Notes (Signed)
[facility]-EMERGENCY DEPT Provider Note   CSN: [id number] Arrival date & time: 08/27/16  1825     History   Chief Complaint Chief Complaint  Patient presents with  . Fall  . Shoulder Pain    HPI Janet Mcdonald is a 70 y.o. female.  HPI   Patient presents with pain in the left shoulder and upper arm after mechanical fall today.  States she did not see a step that had black carpet and black tape over it, fell directly onto her left upper arm.  Denies head injury or LOC, denies neck pain, no numbness or weakness of the arm.  Does note she twisted her left ankle but denies any pain in it currently.  Unable to move the left arm secondary to pain.    Past Medical History:  Diagnosis Date  . Hypertension     There are no active problems to display for this patient.   Past Surgical History:  Procedure Laterality Date  . ABDOMINAL HYSTERECTOMY    . APPENDECTOMY    . Bilateral hip replacment      OB History    No data available       Home Medications    Prior to Admission medications   Medication Sig Start Date End Date Taking? Authorizing Provider  aspirin EC 325 MG tablet Take 325 mg by mouth daily.    Historical Provider, MD  calcium carbonate (TUMS - DOSED IN MG ELEMENTAL CALCIUM) 500 MG chewable tablet Chew 1 tablet by mouth daily.    Historical Provider, MD  Cinnamon 500 MG capsule Take 1,000 mg by mouth 2 (two) times daily.    Historical Provider, MD  FLUZONE HIGH-DOSE 0.5 ML SUSY Inject 1 Dose as directed once. 08/15/15   Historical Provider, MD  guaiFENesin (MUCINEX) 600 MG 12 hr tablet Take 600 mg by mouth daily as needed for cough.    Historical Provider, MD  HYDROcodone-acetaminophen (NORCO/VICODIN) 5-325 MG tablet Take 1-2 tablets by mouth every 4 (four) hours as needed for moderate pain or severe pain. 08/27/16   [name], PA-C  lisinopril-hydrochlorothiazide (PRINZIDE,ZESTORETIC) 20-25 MG per tablet Take 1 tablet by mouth every morning.    Historical  Provider, MD  metoprolol (LOPRESSOR) 100 MG tablet Take 100 mg by mouth 2 (two) times daily.    Historical Provider, MD  Multiple Vitamin (MULTIVITAMIN WITH MINERALS) TABS tablet Take 1 tablet by mouth daily.    Historical Provider, MD  naproxen sodium (ANAPROX) 220 MG tablet Take 220 mg by mouth daily as needed (aches).     Historical Provider, MD  PREVNAR 13 SUSP injection Inject 1 Dose as directed once. 08/03/15   Historical Provider, MD  Turmeric 500 MG CAPS Take 1 capsule by mouth 2 (two) times daily.    Historical Provider, MD    Family History History reviewed. No pertinent family history.  Social History Social History  Substance Use Topics  . Smoking status: Never Smoker  . Smokeless tobacco: Never Used  . Alcohol use No     Allergies   Oxycodone and Tetracyclines & related   Review of Systems Review of Systems  Constitutional: Negative for chills and fever.  Musculoskeletal: Positive for arthralgias, back pain and myalgias. Negative for neck pain and neck stiffness.  Skin: Negative for color change, pallor and wound.  Neurological: Negative for weakness and numbness.  Hematological: Does not bruise/bleed easily.  Psychiatric/Behavioral: Negative for self-injury.     Physical Exam Updated Vital Signs BP (!) 156/103 (BP  Location: Right Arm)   Pulse 82   Temp 98.6 F (37 C) (Oral)   Resp 18   SpO2 97%   Physical Exam  Constitutional: She appears well-developed and well-nourished. No distress.  HENT:  Head: Normocephalic and atraumatic.  Neck: Neck supple.  Pulmonary/Chest: Effort normal. She exhibits no tenderness.  Musculoskeletal:  Tenderness of left upper arm.  No visible ecchymosis.   No c-spine tenderness.   Left grip strength is normal, sensation intact, radial pulse is intact.    Neurological: She is alert.  Skin: She is not diaphoretic.  Nursing note and vitals reviewed.    ED Treatments / Results  Labs (all labs ordered are listed, but only  abnormal results are displayed) Labs Reviewed - No data to display  EKG  EKG Interpretation None       Radiology Dg Shoulder Left  Result Date: 08/27/2016 CLINICAL DATA:  Left shoulder pain since a fall going down stairs today. Initial encounter. EXAM: LEFT SHOULDER - 2+ VIEW COMPARISON:  None. FINDINGS: The patient has an impacted fracture of the surgical neck of the humerus with approximately 1/2 shaft width anterior displacement. The fracture extends into the greater tuberosity. The humeral head is located. The acromioclavicular joint is intact with mild to moderate degenerative change seen. Imaged left lung and ribs are unremarkable. IMPRESSION: Impacted and anteriorly displaced surgical neck fracture left humerus involves the greater tuberosity. Electronically Signed   By: Drusilla Kannerhomas  Dalessio M.D.   On: 08/27/2016 19:37    Procedures Procedures (including critical care time)  Medications Ordered in ED Medications  ibuprofen (ADVIL,MOTRIN) tablet 600 mg (600 mg Oral Given 08/27/16 1930)  HYDROcodone-acetaminophen (NORCO/VICODIN) 5-325 MG per tablet 1 tablet (1 tablet Oral Given 08/27/16 2024)     Initial Impression / Assessment and Plan / ED Course  I have reviewed the triage vital signs and the nursing notes.  Pertinent labs & imaging results that were available during my care of the patient were reviewed by me and considered in my medical decision making (see chart for details).  Clinical Course   Afebrile, nontoxic patient with injury to her left arm with mechanical fall, direct impact to upper arm.   Xray demonstrates displaced humeral surgical neck fracture.  Reviewed xray with Dr Juleen ChinaKohut who also saw the patient.   D/C home with sling, pain medication, orthopedic follow up.  Pt has sees Dr Darrelyn HillockGioffre for other joints, [facility]    Discussed result, findings, treatment, and follow up  with patient.  Pt given return precautions.  Pt verbalizes  understanding and agrees with plan.      Final Clinical Impressions(s) / ED Diagnoses   Final diagnoses:  Closed displaced fracture of surgical neck of left humerus, unspecified fracture morphology, initial encounter    New Prescriptions New Prescriptions   HYDROCODONE-ACETAMINOPHEN (NORCO/VICODIN) 5-325 MG TABLET    Take 1-2 tablets by mouth every 4 (four) hours as needed for moderate pain or severe pain.     Trixie Dredgemily , PA-C 08/27/16 2031    Raeford RazorStephen Kohut, MD 08/28/16 1440

## 2016-08-28 DIAGNOSIS — S42212A Unspecified displaced fracture of surgical neck of left humerus, initial encounter for closed fracture: Secondary | ICD-10-CM | POA: Diagnosis not present

## 2016-08-28 DIAGNOSIS — M25512 Pain in left shoulder: Secondary | ICD-10-CM | POA: Diagnosis not present

## 2016-09-05 DIAGNOSIS — S42212D Unspecified displaced fracture of surgical neck of left humerus, subsequent encounter for fracture with routine healing: Secondary | ICD-10-CM | POA: Diagnosis not present

## 2016-09-23 DIAGNOSIS — S42212D Unspecified displaced fracture of surgical neck of left humerus, subsequent encounter for fracture with routine healing: Secondary | ICD-10-CM | POA: Diagnosis not present

## 2016-10-07 DIAGNOSIS — S42292D Other displaced fracture of upper end of left humerus, subsequent encounter for fracture with routine healing: Secondary | ICD-10-CM | POA: Diagnosis not present

## 2016-10-21 DIAGNOSIS — S42212D Unspecified displaced fracture of surgical neck of left humerus, subsequent encounter for fracture with routine healing: Secondary | ICD-10-CM | POA: Diagnosis not present

## 2016-11-18 DIAGNOSIS — S42212D Unspecified displaced fracture of surgical neck of left humerus, subsequent encounter for fracture with routine healing: Secondary | ICD-10-CM | POA: Diagnosis not present

## 2017-01-23 DIAGNOSIS — L03119 Cellulitis of unspecified part of limb: Secondary | ICD-10-CM | POA: Diagnosis not present

## 2017-01-23 DIAGNOSIS — L0291 Cutaneous abscess, unspecified: Secondary | ICD-10-CM | POA: Diagnosis not present

## 2017-01-24 DIAGNOSIS — L03119 Cellulitis of unspecified part of limb: Secondary | ICD-10-CM | POA: Diagnosis not present

## 2017-01-24 DIAGNOSIS — L0291 Cutaneous abscess, unspecified: Secondary | ICD-10-CM | POA: Diagnosis not present

## 2017-01-27 DIAGNOSIS — L0291 Cutaneous abscess, unspecified: Secondary | ICD-10-CM | POA: Diagnosis not present

## 2017-01-27 DIAGNOSIS — L03115 Cellulitis of right lower limb: Secondary | ICD-10-CM | POA: Diagnosis not present

## 2017-01-30 DIAGNOSIS — L0291 Cutaneous abscess, unspecified: Secondary | ICD-10-CM | POA: Diagnosis not present

## 2017-01-30 DIAGNOSIS — L03119 Cellulitis of unspecified part of limb: Secondary | ICD-10-CM | POA: Diagnosis not present

## 2017-02-20 DIAGNOSIS — L0291 Cutaneous abscess, unspecified: Secondary | ICD-10-CM | POA: Diagnosis not present

## 2017-02-20 DIAGNOSIS — L03119 Cellulitis of unspecified part of limb: Secondary | ICD-10-CM | POA: Diagnosis not present

## 2017-02-28 ENCOUNTER — Encounter (HOSPITAL_BASED_OUTPATIENT_CLINIC_OR_DEPARTMENT_OTHER): Payer: Medicare Other | Attending: Internal Medicine

## 2017-02-28 DIAGNOSIS — Z96643 Presence of artificial hip joint, bilateral: Secondary | ICD-10-CM | POA: Insufficient documentation

## 2017-02-28 DIAGNOSIS — L97212 Non-pressure chronic ulcer of right calf with fat layer exposed: Secondary | ICD-10-CM | POA: Insufficient documentation

## 2017-02-28 DIAGNOSIS — I10 Essential (primary) hypertension: Secondary | ICD-10-CM | POA: Diagnosis not present

## 2017-02-28 DIAGNOSIS — L97112 Non-pressure chronic ulcer of right thigh with fat layer exposed: Secondary | ICD-10-CM | POA: Diagnosis not present

## 2017-02-28 DIAGNOSIS — B9562 Methicillin resistant Staphylococcus aureus infection as the cause of diseases classified elsewhere: Secondary | ICD-10-CM | POA: Insufficient documentation

## 2017-02-28 DIAGNOSIS — L02415 Cutaneous abscess of right lower limb: Secondary | ICD-10-CM | POA: Diagnosis not present

## 2017-03-04 ENCOUNTER — Other Ambulatory Visit: Payer: Self-pay | Admitting: Internal Medicine

## 2017-03-04 DIAGNOSIS — L97113 Non-pressure chronic ulcer of right thigh with necrosis of muscle: Secondary | ICD-10-CM

## 2017-03-04 DIAGNOSIS — A4902 Methicillin resistant Staphylococcus aureus infection, unspecified site: Secondary | ICD-10-CM

## 2017-03-04 DIAGNOSIS — L02415 Cutaneous abscess of right lower limb: Secondary | ICD-10-CM

## 2017-03-04 DIAGNOSIS — T148XXA Other injury of unspecified body region, initial encounter: Secondary | ICD-10-CM

## 2017-03-06 ENCOUNTER — Ambulatory Visit (HOSPITAL_COMMUNITY)
Admission: RE | Admit: 2017-03-06 | Discharge: 2017-03-06 | Disposition: A | Discharge status: Home / Self Care | Payer: Medicare Other | Source: Ambulatory Visit | Attending: Internal Medicine | Admitting: Internal Medicine

## 2017-03-06 DIAGNOSIS — A4902 Methicillin resistant Staphylococcus aureus infection, unspecified site: Secondary | ICD-10-CM | POA: Diagnosis not present

## 2017-03-06 DIAGNOSIS — L02415 Cutaneous abscess of right lower limb: Secondary | ICD-10-CM | POA: Insufficient documentation

## 2017-03-06 DIAGNOSIS — S71101A Unspecified open wound, right thigh, initial encounter: Secondary | ICD-10-CM | POA: Diagnosis not present

## 2017-03-06 DIAGNOSIS — L97113 Non-pressure chronic ulcer of right thigh with necrosis of muscle: Secondary | ICD-10-CM

## 2017-03-07 ENCOUNTER — Encounter (HOSPITAL_BASED_OUTPATIENT_CLINIC_OR_DEPARTMENT_OTHER): Payer: Medicare Other | Attending: Internal Medicine

## 2017-03-07 DIAGNOSIS — M1990 Unspecified osteoarthritis, unspecified site: Secondary | ICD-10-CM | POA: Insufficient documentation

## 2017-03-07 DIAGNOSIS — L02415 Cutaneous abscess of right lower limb: Secondary | ICD-10-CM | POA: Diagnosis not present

## 2017-03-07 DIAGNOSIS — S71101A Unspecified open wound, right thigh, initial encounter: Secondary | ICD-10-CM | POA: Diagnosis not present

## 2017-03-07 DIAGNOSIS — L97113 Non-pressure chronic ulcer of right thigh with necrosis of muscle: Secondary | ICD-10-CM | POA: Diagnosis not present

## 2017-03-07 DIAGNOSIS — I10 Essential (primary) hypertension: Secondary | ICD-10-CM | POA: Insufficient documentation

## 2017-03-07 DIAGNOSIS — H2510 Age-related nuclear cataract, unspecified eye: Secondary | ICD-10-CM | POA: Insufficient documentation

## 2017-03-07 DIAGNOSIS — A4902 Methicillin resistant Staphylococcus aureus infection, unspecified site: Secondary | ICD-10-CM | POA: Diagnosis not present

## 2017-03-14 DIAGNOSIS — L02415 Cutaneous abscess of right lower limb: Secondary | ICD-10-CM | POA: Diagnosis not present

## 2017-03-14 DIAGNOSIS — M1990 Unspecified osteoarthritis, unspecified site: Secondary | ICD-10-CM | POA: Diagnosis not present

## 2017-03-14 DIAGNOSIS — A4902 Methicillin resistant Staphylococcus aureus infection, unspecified site: Secondary | ICD-10-CM | POA: Diagnosis not present

## 2017-03-14 DIAGNOSIS — H2510 Age-related nuclear cataract, unspecified eye: Secondary | ICD-10-CM | POA: Diagnosis not present

## 2017-03-14 DIAGNOSIS — L97113 Non-pressure chronic ulcer of right thigh with necrosis of muscle: Secondary | ICD-10-CM | POA: Diagnosis not present

## 2017-03-14 DIAGNOSIS — I10 Essential (primary) hypertension: Secondary | ICD-10-CM | POA: Diagnosis not present

## 2017-03-14 DIAGNOSIS — L97112 Non-pressure chronic ulcer of right thigh with fat layer exposed: Secondary | ICD-10-CM | POA: Diagnosis not present

## 2017-03-19 DIAGNOSIS — E782 Mixed hyperlipidemia: Secondary | ICD-10-CM | POA: Diagnosis not present

## 2017-03-19 DIAGNOSIS — M170 Bilateral primary osteoarthritis of knee: Secondary | ICD-10-CM | POA: Diagnosis not present

## 2017-03-19 DIAGNOSIS — I10 Essential (primary) hypertension: Secondary | ICD-10-CM | POA: Diagnosis not present

## 2017-03-19 DIAGNOSIS — Z23 Encounter for immunization: Secondary | ICD-10-CM | POA: Diagnosis not present

## 2017-03-19 DIAGNOSIS — R7303 Prediabetes: Secondary | ICD-10-CM | POA: Diagnosis not present

## 2017-03-19 DIAGNOSIS — Z1211 Encounter for screening for malignant neoplasm of colon: Secondary | ICD-10-CM | POA: Diagnosis not present

## 2017-03-21 DIAGNOSIS — I10 Essential (primary) hypertension: Secondary | ICD-10-CM | POA: Diagnosis not present

## 2017-03-21 DIAGNOSIS — S71101A Unspecified open wound, right thigh, initial encounter: Secondary | ICD-10-CM | POA: Diagnosis not present

## 2017-03-21 DIAGNOSIS — L02415 Cutaneous abscess of right lower limb: Secondary | ICD-10-CM | POA: Diagnosis not present

## 2017-03-21 DIAGNOSIS — S81801A Unspecified open wound, right lower leg, initial encounter: Secondary | ICD-10-CM | POA: Diagnosis not present

## 2017-03-21 DIAGNOSIS — H2510 Age-related nuclear cataract, unspecified eye: Secondary | ICD-10-CM | POA: Diagnosis not present

## 2017-03-21 DIAGNOSIS — L97113 Non-pressure chronic ulcer of right thigh with necrosis of muscle: Secondary | ICD-10-CM | POA: Diagnosis not present

## 2017-03-21 DIAGNOSIS — A4902 Methicillin resistant Staphylococcus aureus infection, unspecified site: Secondary | ICD-10-CM | POA: Diagnosis not present

## 2017-03-21 DIAGNOSIS — M1990 Unspecified osteoarthritis, unspecified site: Secondary | ICD-10-CM | POA: Diagnosis not present

## 2017-03-28 ENCOUNTER — Other Ambulatory Visit (HOSPITAL_COMMUNITY)
Admission: RE | Admit: 2017-03-28 | Discharge: 2017-03-28 | Disposition: A | Discharge status: Home / Self Care | Payer: Medicare Other | Source: Other Acute Inpatient Hospital | Attending: Internal Medicine | Admitting: Internal Medicine

## 2017-03-28 DIAGNOSIS — S71101A Unspecified open wound, right thigh, initial encounter: Secondary | ICD-10-CM | POA: Diagnosis not present

## 2017-03-28 DIAGNOSIS — L02415 Cutaneous abscess of right lower limb: Secondary | ICD-10-CM | POA: Diagnosis not present

## 2017-03-28 DIAGNOSIS — I10 Essential (primary) hypertension: Secondary | ICD-10-CM | POA: Diagnosis not present

## 2017-03-28 DIAGNOSIS — M1990 Unspecified osteoarthritis, unspecified site: Secondary | ICD-10-CM | POA: Diagnosis not present

## 2017-03-28 DIAGNOSIS — L97113 Non-pressure chronic ulcer of right thigh with necrosis of muscle: Secondary | ICD-10-CM | POA: Insufficient documentation

## 2017-03-28 DIAGNOSIS — H2510 Age-related nuclear cataract, unspecified eye: Secondary | ICD-10-CM | POA: Diagnosis not present

## 2017-03-28 DIAGNOSIS — A4902 Methicillin resistant Staphylococcus aureus infection, unspecified site: Secondary | ICD-10-CM | POA: Diagnosis not present

## 2017-03-31 LAB — AEROBIC CULTURE W GRAM STAIN (SUPERFICIAL SPECIMEN)

## 2017-03-31 LAB — AEROBIC CULTURE  (SUPERFICIAL SPECIMEN)

## 2017-04-04 ENCOUNTER — Encounter (HOSPITAL_BASED_OUTPATIENT_CLINIC_OR_DEPARTMENT_OTHER): Payer: Medicare Other | Attending: Internal Medicine

## 2017-04-04 DIAGNOSIS — B9562 Methicillin resistant Staphylococcus aureus infection as the cause of diseases classified elsewhere: Secondary | ICD-10-CM | POA: Insufficient documentation

## 2017-04-04 DIAGNOSIS — L02415 Cutaneous abscess of right lower limb: Secondary | ICD-10-CM | POA: Diagnosis not present

## 2017-04-04 DIAGNOSIS — A4902 Methicillin resistant Staphylococcus aureus infection, unspecified site: Secondary | ICD-10-CM | POA: Diagnosis not present

## 2017-04-04 DIAGNOSIS — L98492 Non-pressure chronic ulcer of skin of other sites with fat layer exposed: Secondary | ICD-10-CM | POA: Diagnosis not present

## 2017-04-04 DIAGNOSIS — Z96641 Presence of right artificial hip joint: Secondary | ICD-10-CM | POA: Insufficient documentation

## 2017-04-04 DIAGNOSIS — Z8614 Personal history of Methicillin resistant Staphylococcus aureus infection: Secondary | ICD-10-CM | POA: Diagnosis not present

## 2017-04-04 DIAGNOSIS — I10 Essential (primary) hypertension: Secondary | ICD-10-CM | POA: Diagnosis not present

## 2017-04-04 DIAGNOSIS — B954 Other streptococcus as the cause of diseases classified elsewhere: Secondary | ICD-10-CM | POA: Diagnosis not present

## 2017-04-04 DIAGNOSIS — L97113 Non-pressure chronic ulcer of right thigh with necrosis of muscle: Secondary | ICD-10-CM | POA: Diagnosis not present

## 2017-04-04 DIAGNOSIS — S71101A Unspecified open wound, right thigh, initial encounter: Secondary | ICD-10-CM | POA: Diagnosis not present

## 2017-04-08 ENCOUNTER — Other Ambulatory Visit: Payer: Self-pay | Admitting: Internal Medicine

## 2017-04-08 DIAGNOSIS — S81801D Unspecified open wound, right lower leg, subsequent encounter: Secondary | ICD-10-CM

## 2017-04-10 ENCOUNTER — Ambulatory Visit (HOSPITAL_COMMUNITY)
Admission: RE | Admit: 2017-04-10 | Discharge: 2017-04-10 | Disposition: A | Discharge status: Home / Self Care | Payer: Medicare Other | Source: Ambulatory Visit | Attending: Internal Medicine | Admitting: Internal Medicine

## 2017-04-10 ENCOUNTER — Other Ambulatory Visit: Payer: Self-pay | Admitting: Internal Medicine

## 2017-04-10 DIAGNOSIS — S81801D Unspecified open wound, right lower leg, subsequent encounter: Secondary | ICD-10-CM | POA: Diagnosis not present

## 2017-04-10 DIAGNOSIS — M1711 Unilateral primary osteoarthritis, right knee: Secondary | ICD-10-CM | POA: Insufficient documentation

## 2017-04-10 DIAGNOSIS — X58XXXD Exposure to other specified factors, subsequent encounter: Secondary | ICD-10-CM | POA: Insufficient documentation

## 2017-04-10 LAB — POCT I-STAT CREATININE: Creatinine, Ser: 0.6 mg/dL (ref 0.44–1.00)

## 2017-04-10 MED ORDER — IOPAMIDOL (ISOVUE-300) INJECTION 61%
INTRAVENOUS | Status: AC
  Administered 2017-04-10: 100 mL
  Filled 2017-04-10: qty 100

## 2017-04-11 DIAGNOSIS — Z96641 Presence of right artificial hip joint: Secondary | ICD-10-CM | POA: Diagnosis not present

## 2017-04-11 DIAGNOSIS — L02415 Cutaneous abscess of right lower limb: Secondary | ICD-10-CM | POA: Diagnosis not present

## 2017-04-11 DIAGNOSIS — B9562 Methicillin resistant Staphylococcus aureus infection as the cause of diseases classified elsewhere: Secondary | ICD-10-CM | POA: Diagnosis not present

## 2017-04-11 DIAGNOSIS — Z8614 Personal history of Methicillin resistant Staphylococcus aureus infection: Secondary | ICD-10-CM | POA: Diagnosis not present

## 2017-04-11 DIAGNOSIS — I10 Essential (primary) hypertension: Secondary | ICD-10-CM | POA: Diagnosis not present

## 2017-04-11 DIAGNOSIS — L98492 Non-pressure chronic ulcer of skin of other sites with fat layer exposed: Secondary | ICD-10-CM | POA: Diagnosis not present

## 2017-04-11 DIAGNOSIS — L928 Other granulomatous disorders of the skin and subcutaneous tissue: Secondary | ICD-10-CM | POA: Diagnosis not present

## 2017-04-18 DIAGNOSIS — S71101A Unspecified open wound, right thigh, initial encounter: Secondary | ICD-10-CM | POA: Diagnosis not present

## 2017-04-18 DIAGNOSIS — L98492 Non-pressure chronic ulcer of skin of other sites with fat layer exposed: Secondary | ICD-10-CM | POA: Diagnosis not present

## 2017-04-18 DIAGNOSIS — Z8614 Personal history of Methicillin resistant Staphylococcus aureus infection: Secondary | ICD-10-CM | POA: Diagnosis not present

## 2017-04-18 DIAGNOSIS — I10 Essential (primary) hypertension: Secondary | ICD-10-CM | POA: Diagnosis not present

## 2017-04-18 DIAGNOSIS — L02415 Cutaneous abscess of right lower limb: Secondary | ICD-10-CM | POA: Diagnosis not present

## 2017-04-18 DIAGNOSIS — Z96641 Presence of right artificial hip joint: Secondary | ICD-10-CM | POA: Diagnosis not present

## 2017-04-18 DIAGNOSIS — B9562 Methicillin resistant Staphylococcus aureus infection as the cause of diseases classified elsewhere: Secondary | ICD-10-CM | POA: Diagnosis not present

## 2017-04-25 DIAGNOSIS — Z96641 Presence of right artificial hip joint: Secondary | ICD-10-CM | POA: Diagnosis not present

## 2017-04-25 DIAGNOSIS — Z8614 Personal history of Methicillin resistant Staphylococcus aureus infection: Secondary | ICD-10-CM | POA: Diagnosis not present

## 2017-04-25 DIAGNOSIS — I10 Essential (primary) hypertension: Secondary | ICD-10-CM | POA: Diagnosis not present

## 2017-04-25 DIAGNOSIS — L928 Other granulomatous disorders of the skin and subcutaneous tissue: Secondary | ICD-10-CM | POA: Diagnosis not present

## 2017-04-25 DIAGNOSIS — L98492 Non-pressure chronic ulcer of skin of other sites with fat layer exposed: Secondary | ICD-10-CM | POA: Diagnosis not present

## 2017-04-25 DIAGNOSIS — B9562 Methicillin resistant Staphylococcus aureus infection as the cause of diseases classified elsewhere: Secondary | ICD-10-CM | POA: Diagnosis not present

## 2017-04-25 DIAGNOSIS — L02415 Cutaneous abscess of right lower limb: Secondary | ICD-10-CM | POA: Diagnosis not present

## 2017-05-02 DIAGNOSIS — L98492 Non-pressure chronic ulcer of skin of other sites with fat layer exposed: Secondary | ICD-10-CM | POA: Diagnosis not present

## 2017-05-02 DIAGNOSIS — B9562 Methicillin resistant Staphylococcus aureus infection as the cause of diseases classified elsewhere: Secondary | ICD-10-CM | POA: Diagnosis not present

## 2017-05-02 DIAGNOSIS — Z8614 Personal history of Methicillin resistant Staphylococcus aureus infection: Secondary | ICD-10-CM | POA: Diagnosis not present

## 2017-05-02 DIAGNOSIS — I10 Essential (primary) hypertension: Secondary | ICD-10-CM | POA: Diagnosis not present

## 2017-05-02 DIAGNOSIS — S71101A Unspecified open wound, right thigh, initial encounter: Secondary | ICD-10-CM | POA: Diagnosis not present

## 2017-05-02 DIAGNOSIS — Z96641 Presence of right artificial hip joint: Secondary | ICD-10-CM | POA: Diagnosis not present

## 2017-05-02 DIAGNOSIS — L02415 Cutaneous abscess of right lower limb: Secondary | ICD-10-CM | POA: Diagnosis not present

## 2017-05-15 ENCOUNTER — Encounter (HOSPITAL_BASED_OUTPATIENT_CLINIC_OR_DEPARTMENT_OTHER): Payer: Medicare Other | Attending: Internal Medicine

## 2017-05-15 ENCOUNTER — Other Ambulatory Visit: Payer: Self-pay | Admitting: Internal Medicine

## 2017-05-15 ENCOUNTER — Other Ambulatory Visit (HOSPITAL_COMMUNITY)
Admission: RE | Admit: 2017-05-15 | Discharge: 2017-05-15 | Disposition: A | Discharge status: Home / Self Care | Payer: Medicare Other | Source: Other Acute Inpatient Hospital | Attending: Internal Medicine | Admitting: Internal Medicine

## 2017-05-15 DIAGNOSIS — L97113 Non-pressure chronic ulcer of right thigh with necrosis of muscle: Secondary | ICD-10-CM | POA: Insufficient documentation

## 2017-05-15 DIAGNOSIS — Z8614 Personal history of Methicillin resistant Staphylococcus aureus infection: Secondary | ICD-10-CM | POA: Insufficient documentation

## 2017-05-15 DIAGNOSIS — B9562 Methicillin resistant Staphylococcus aureus infection as the cause of diseases classified elsewhere: Secondary | ICD-10-CM | POA: Diagnosis not present

## 2017-05-15 DIAGNOSIS — S71101A Unspecified open wound, right thigh, initial encounter: Secondary | ICD-10-CM | POA: Diagnosis not present

## 2017-05-15 DIAGNOSIS — Z96643 Presence of artificial hip joint, bilateral: Secondary | ICD-10-CM | POA: Insufficient documentation

## 2017-05-15 DIAGNOSIS — I10 Essential (primary) hypertension: Secondary | ICD-10-CM | POA: Insufficient documentation

## 2017-05-15 DIAGNOSIS — L97212 Non-pressure chronic ulcer of right calf with fat layer exposed: Secondary | ICD-10-CM | POA: Diagnosis not present

## 2017-05-15 DIAGNOSIS — L02415 Cutaneous abscess of right lower limb: Secondary | ICD-10-CM | POA: Insufficient documentation

## 2017-05-15 DIAGNOSIS — L97119 Non-pressure chronic ulcer of right thigh with unspecified severity: Secondary | ICD-10-CM

## 2017-05-15 DIAGNOSIS — L98492 Non-pressure chronic ulcer of skin of other sites with fat layer exposed: Secondary | ICD-10-CM | POA: Insufficient documentation

## 2017-05-16 ENCOUNTER — Encounter (HOSPITAL_BASED_OUTPATIENT_CLINIC_OR_DEPARTMENT_OTHER): Payer: Medicare Other

## 2017-05-16 DIAGNOSIS — I10 Essential (primary) hypertension: Secondary | ICD-10-CM | POA: Diagnosis not present

## 2017-05-16 DIAGNOSIS — Z96643 Presence of artificial hip joint, bilateral: Secondary | ICD-10-CM | POA: Diagnosis not present

## 2017-05-16 DIAGNOSIS — L97212 Non-pressure chronic ulcer of right calf with fat layer exposed: Secondary | ICD-10-CM | POA: Diagnosis not present

## 2017-05-16 DIAGNOSIS — B9562 Methicillin resistant Staphylococcus aureus infection as the cause of diseases classified elsewhere: Secondary | ICD-10-CM | POA: Diagnosis not present

## 2017-05-19 LAB — AEROBIC CULTURE  (SUPERFICIAL SPECIMEN): CULTURE: NO GROWTH

## 2017-05-19 LAB — AEROBIC CULTURE W GRAM STAIN (SUPERFICIAL SPECIMEN)

## 2017-05-23 ENCOUNTER — Other Ambulatory Visit: Payer: Self-pay | Admitting: Radiology

## 2017-05-23 ENCOUNTER — Other Ambulatory Visit: Payer: Self-pay | Admitting: General Surgery

## 2017-05-23 DIAGNOSIS — I10 Essential (primary) hypertension: Secondary | ICD-10-CM | POA: Diagnosis not present

## 2017-05-23 DIAGNOSIS — S71101A Unspecified open wound, right thigh, initial encounter: Secondary | ICD-10-CM | POA: Diagnosis not present

## 2017-05-23 DIAGNOSIS — Z96643 Presence of artificial hip joint, bilateral: Secondary | ICD-10-CM | POA: Diagnosis not present

## 2017-05-23 DIAGNOSIS — B9562 Methicillin resistant Staphylococcus aureus infection as the cause of diseases classified elsewhere: Secondary | ICD-10-CM | POA: Diagnosis not present

## 2017-05-23 DIAGNOSIS — L97212 Non-pressure chronic ulcer of right calf with fat layer exposed: Secondary | ICD-10-CM | POA: Diagnosis not present

## 2017-05-26 ENCOUNTER — Ambulatory Visit (HOSPITAL_COMMUNITY)
Admission: RE | Admit: 2017-05-26 | Discharge: 2017-05-26 | Disposition: A | Discharge status: Home / Self Care | Payer: Medicare Other | Source: Ambulatory Visit | Attending: Internal Medicine | Admitting: Internal Medicine

## 2017-05-26 ENCOUNTER — Encounter (HOSPITAL_COMMUNITY): Payer: Self-pay

## 2017-05-26 DIAGNOSIS — Z885 Allergy status to narcotic agent status: Secondary | ICD-10-CM | POA: Diagnosis not present

## 2017-05-26 DIAGNOSIS — M1711 Unilateral primary osteoarthritis, right knee: Secondary | ICD-10-CM | POA: Diagnosis not present

## 2017-05-26 DIAGNOSIS — L97119 Non-pressure chronic ulcer of right thigh with unspecified severity: Secondary | ICD-10-CM | POA: Diagnosis not present

## 2017-05-26 DIAGNOSIS — I10 Essential (primary) hypertension: Secondary | ICD-10-CM | POA: Insufficient documentation

## 2017-05-26 DIAGNOSIS — S71101A Unspecified open wound, right thigh, initial encounter: Secondary | ICD-10-CM | POA: Diagnosis not present

## 2017-05-26 DIAGNOSIS — Z7982 Long term (current) use of aspirin: Secondary | ICD-10-CM | POA: Diagnosis not present

## 2017-05-26 DIAGNOSIS — Z881 Allergy status to other antibiotic agents status: Secondary | ICD-10-CM | POA: Diagnosis not present

## 2017-05-26 DIAGNOSIS — L97113 Non-pressure chronic ulcer of right thigh with necrosis of muscle: Secondary | ICD-10-CM | POA: Insufficient documentation

## 2017-05-26 DIAGNOSIS — Z79899 Other long term (current) drug therapy: Secondary | ICD-10-CM | POA: Insufficient documentation

## 2017-05-26 DIAGNOSIS — Z8614 Personal history of Methicillin resistant Staphylococcus aureus infection: Secondary | ICD-10-CM | POA: Insufficient documentation

## 2017-05-26 LAB — CBC
HCT: 39.9 % (ref 36.0–46.0)
Hemoglobin: 14.2 g/dL (ref 12.0–15.0)
MCH: 29 pg (ref 26.0–34.0)
MCHC: 35.6 g/dL (ref 30.0–36.0)
MCV: 81.4 fL (ref 78.0–100.0)
PLATELETS: 240 10*3/uL (ref 150–400)
RBC: 4.9 MIL/uL (ref 3.87–5.11)
RDW: 12.8 % (ref 11.5–15.5)
WBC: 8.3 10*3/uL (ref 4.0–10.5)

## 2017-05-26 LAB — PROTIME-INR
INR: 1.02
PROTHROMBIN TIME: 13.4 s (ref 11.4–15.2)

## 2017-05-26 LAB — APTT: APTT: 32 s (ref 24–36)

## 2017-05-26 MED ORDER — MIDAZOLAM HCL 2 MG/2ML IJ SOLN
INTRAMUSCULAR | Status: DC
Start: 2017-05-26 — End: 2017-05-27
  Filled 2017-05-26: qty 6

## 2017-05-26 MED ORDER — MIDAZOLAM HCL 2 MG/2ML IJ SOLN
INTRAMUSCULAR | Status: AC | PRN
  Administered 2017-05-26: 1 mg via INTRAVENOUS

## 2017-05-26 MED ORDER — FENTANYL CITRATE (PF) 100 MCG/2ML IJ SOLN
INTRAMUSCULAR | Status: AC
  Filled 2017-05-26: qty 4

## 2017-05-26 MED ORDER — FENTANYL CITRATE (PF) 100 MCG/2ML IJ SOLN
INTRAMUSCULAR | Status: AC | PRN
  Administered 2017-05-26: 50 ug via INTRAVENOUS

## 2017-05-26 MED ORDER — SODIUM CHLORIDE 0.9 % IV SOLN
INTRAVENOUS | Status: DC
  Administered 2017-05-26: 12:00:00 via INTRAVENOUS

## 2017-05-26 NOTE — Procedures (Signed)
Interventional Radiology Procedure Note  Procedure: US guided aspiration of fluid collection, posterior right thigh.  ~3-4 cc of thin serosanguinous fluid aspirated.   US survey shows superficial fluid collection tracking to the skin surface, where there is a small draining wound.  The US also shows a connection through the posterior thigh muscles to a deeper collection more superior and overlying the posterior femoral cortex.   Fluid culture to the lab.  .  Complications: None Recommendations:  - Routine wound care - follow up culture - Observe follow up wound care   Signed,  Yvone NeuJaime S. Loreta AveWagner, DO

## 2017-05-26 NOTE — Discharge Instructions (Signed)
°  Continue dressing changes as you were previously doing. May resume tonight. Call MD for any fever, increased pain, redness, drainage, or redness at site.          Moderate Conscious Sedation, Adult, Care After These instructions provide you with information about caring for yourself after your procedure. Your health care provider may also give you more specific instructions. Your treatment has been planned according to current medical practices, but problems sometimes occur. Call your health care provider if you have any problems or questions after your procedure. What can I expect after the procedure? After your procedure, it is common:  To feel sleepy for several hours.  To feel clumsy and have poor balance for several hours.  To have poor judgment for several hours.  To vomit if you eat too soon.  Follow these instructions at home: For at least 24 hours after the procedure:   Do not: ? Participate in activities where you could fall or become injured. ? Drive. ? Use heavy machinery. ? Drink alcohol. ? Take sleeping pills or medicines that cause drowsiness. ? Make important decisions or sign legal documents. ? Take care of children on your own.  Rest. Eating and drinking  Follow the diet recommended by your health care provider.  If you vomit: ? Drink water, juice, or soup when you can drink without vomiting. ? Make sure you have little or no nausea before eating solid foods. General instructions  Have a responsible adult stay with you until you are awake and alert.  Take over-the-counter and prescription medicines only as told by your health care provider.  If you smoke, do not smoke without supervision.  Keep all follow-up visits as told by your health care provider. This is important. Contact a health care provider if:  You keep feeling nauseous or you keep vomiting.  You feel light-headed.  You develop a rash.  You have a fever. Get help right away  if:  You have trouble breathing. This information is not intended to replace advice given to you by your health care provider. Make sure you discuss any questions you have with your health care provider. Document Released: 08/11/2013 Document Revised: 03/25/2016 Document Reviewed: 02/10/2016 Elsevier Interactive Patient Education  [employer]2018 [employer]

## 2017-05-26 NOTE — Consult Note (Signed)
Chief Complaint: Patient was seen in consultation today for US guided aspiration of right posterior thigh fluid collection  Referring Physician(s): Robson,Michael G  Supervising Physician: Gilmer Mor  Patient Status: [facility] - Out-pt  History of Present Illness: Janet Mcdonald is a 71 y.o. female with history of persistent right posterior thigh infection/fluid collection since March of this year. Prior cultures have revealed MRSA as well as viridans streptococcus. She has been treated with clindamycin, doxycycline and zyvox . She has been off antibiotic therapy for the past 2-3 weeks. Recent CT reveals:  Small rim enhancing fluid collection in the subcutaneous fatty tissues of the right upper leg is consistent with an abscess and measures smaller than on the prior ultrasound. Stranding in the adjacent subcutaneous fat is consistent with mild cellulitis.  Osteoarthritis right knee appearing worst in the patellofemoral and lateral compartments.  Negative for myositis or osteomyelitis  Request now received for ultrasound-guided aspiration of the right posterior thigh fluid collection.  Past Medical History:  Diagnosis Date  . Hypertension     Past Surgical History:  Procedure Laterality Date  . ABDOMINAL HYSTERECTOMY    . APPENDECTOMY    . Bilateral hip replacment      Allergies: Oxycodone and Tetracyclines & related  Medications: Prior to Admission medications   Medication Sig Start Date End Date Taking? Authorizing Provider  aspirin EC 325 MG tablet Take 325 mg by mouth daily.    [provider]  calcium carbonate (TUMS - DOSED IN MG ELEMENTAL CALCIUM) 500 MG chewable tablet Chew 1 tablet by mouth daily.    [provider]  Cinnamon 500 MG capsule Take 1,000 mg by mouth 2 (two) times daily.    [provider]  FLUZONE HIGH-DOSE 0.5 ML SUSY Inject 1 Dose as directed once. 08/15/15   [provider]  guaiFENesin (MUCINEX) 600  MG 12 hr tablet Take 600 mg by mouth daily as needed for cough.    [provider]  HYDROcodone-acetaminophen (NORCO/VICODIN) 5-325 MG tablet Take 1-2 tablets by mouth every 4 (four) hours as needed for moderate pain or severe pain. 08/27/16   [name], PA-C  lisinopril-hydrochlorothiazide (PRINZIDE,ZESTORETIC) 20-25 MG per tablet Take 1 tablet by mouth every morning.    [provider]  metoprolol (LOPRESSOR) 100 MG tablet Take 100 mg by mouth 2 (two) times daily.    [provider]  Multiple Vitamin (MULTIVITAMIN WITH MINERALS) TABS tablet Take 1 tablet by mouth daily.    [provider]  naproxen sodium (ANAPROX) 220 MG tablet Take 220 mg by mouth daily as needed (aches).     [provider]  PREVNAR 13 SUSP injection Inject 1 Dose as directed once. 08/03/15   [provider]  Turmeric 500 MG CAPS Take 1 capsule by mouth 2 (two) times daily.    [provider]     No family history on file.  Social History   Social History  . Marital status: Married    Spouse name: N/A  . Number of children: N/A  . Years of education: N/A   Social History Main Topics  . Smoking status: Never Smoker  . Smokeless tobacco: Never Used  . Alcohol use No  . Drug use: No  . Sexual activity: Not on file   Other Topics Concern  . Not on file   Social History Narrative  . No narrative on file      Review of Systems denies fever, headache, chest pain, dyspnea,  cough, abdominal/back pain, nausea, vomiting or bleeding.  Vital Signs: BP (!) 174/88 (BP Location: Right Arm)   Pulse 74   Temp 98.1 F (36.7 C) (Oral)   Resp 16   SpO2 98%   Physical Exam awake, alert. Chest clear to auscultation bilaterally. Heart with regular rate and rhythm. Abdomen soft, positive bowel sounds, nontender. No lower extremity edema. Right posterior thigh wound covered with adhesive bandage.  Mallampati Score:     Imaging: No results  found.  Labs:  CBC:  Recent Labs  05/26/17 1107  WBC 8.3  HGB 14.2  HCT 39.9  PLT 240    COAGS: No results for input(s): INR, APTT in the last 8760 hours.  BMP:  Recent Labs  04/10/17 1415  CREATININE 0.60    LIVER FUNCTION TESTS: No results for input(s): BILITOT, AST, ALT, ALKPHOS, PROT, ALBUMIN in the last 8760 hours.  TUMOR MARKERS: No results for input(s): AFPTM, CEA, CA199, CHROMGRNA in the last 8760 hours.  Assessment and Plan: 71 y.o. female with history of persistent right posterior thigh infection/fluid collection since March of this year. Prior cultures have revealed MRSA as well as viridans streptococcus. She has been treated with clindamycin, doxycycline and zyvox . She has been off antibiotic therapy for the past 2-3 weeks. Recent CT reveals:  Small rim enhancing fluid collection in the subcutaneous fatty tissues of the right upper leg is consistent with an abscess and measures smaller than on the prior ultrasound. Stranding in the adjacent subcutaneous fat is consistent with mild cellulitis.  Osteoarthritis right knee appearing worst in the patellofemoral and lateral compartments.  Negative for myositis or osteomyelitis  Request now received for ultrasound-guided aspiration of the right posterior thigh fluid collection.Risks and benefits discussed with the patient/husband including, but not limited to bleeding, infection, damage to adjacent structures or low yield requiring additional tests.All of the patient's questions were answered, patient is agreeable to proceed.Consent signed and in chart. Labs pending.     Thank you for this interesting consult.  I greatly enjoyed meeting Janet Mcdonald and look forward to participating in their care.  A copy of this report was sent to the requesting provider on this date.  Electronically Signed: D. Jeananne RamaKevin , PA-C 05/26/2017, 11:38 AM   I spent a total of 25 minutes  in face to face in clinical  consultation, greater than 50% of which was counseling/coordinating care for ultrasound-guided aspiration of right posterior thigh fluid collection

## 2017-05-30 DIAGNOSIS — I10 Essential (primary) hypertension: Secondary | ICD-10-CM | POA: Diagnosis not present

## 2017-05-30 DIAGNOSIS — Z96643 Presence of artificial hip joint, bilateral: Secondary | ICD-10-CM | POA: Diagnosis not present

## 2017-05-30 DIAGNOSIS — L02415 Cutaneous abscess of right lower limb: Secondary | ICD-10-CM | POA: Diagnosis not present

## 2017-05-30 DIAGNOSIS — L97212 Non-pressure chronic ulcer of right calf with fat layer exposed: Secondary | ICD-10-CM | POA: Diagnosis not present

## 2017-05-30 DIAGNOSIS — B9562 Methicillin resistant Staphylococcus aureus infection as the cause of diseases classified elsewhere: Secondary | ICD-10-CM | POA: Diagnosis not present

## 2017-05-31 LAB — AEROBIC/ANAEROBIC CULTURE W GRAM STAIN (SURGICAL/DEEP WOUND): Culture: NO GROWTH

## 2017-06-06 ENCOUNTER — Encounter (HOSPITAL_BASED_OUTPATIENT_CLINIC_OR_DEPARTMENT_OTHER): Payer: Medicare Other | Attending: Internal Medicine

## 2017-06-06 DIAGNOSIS — L988 Other specified disorders of the skin and subcutaneous tissue: Secondary | ICD-10-CM | POA: Insufficient documentation

## 2017-06-06 DIAGNOSIS — L02415 Cutaneous abscess of right lower limb: Secondary | ICD-10-CM | POA: Diagnosis not present

## 2017-06-06 DIAGNOSIS — Z96641 Presence of right artificial hip joint: Secondary | ICD-10-CM | POA: Insufficient documentation

## 2017-06-06 DIAGNOSIS — L97113 Non-pressure chronic ulcer of right thigh with necrosis of muscle: Secondary | ICD-10-CM | POA: Insufficient documentation

## 2017-06-06 DIAGNOSIS — Z8614 Personal history of Methicillin resistant Staphylococcus aureus infection: Secondary | ICD-10-CM | POA: Diagnosis not present

## 2017-06-06 DIAGNOSIS — I10 Essential (primary) hypertension: Secondary | ICD-10-CM | POA: Insufficient documentation

## 2017-06-06 DIAGNOSIS — S71101A Unspecified open wound, right thigh, initial encounter: Secondary | ICD-10-CM | POA: Diagnosis not present

## 2017-06-13 ENCOUNTER — Other Ambulatory Visit (HOSPITAL_COMMUNITY)
Admission: RE | Admit: 2017-06-13 | Discharge: 2017-06-13 | Disposition: A | Discharge status: Home / Self Care | Payer: Medicare Other | Source: Other Acute Inpatient Hospital | Attending: Surgery | Admitting: Surgery

## 2017-06-13 DIAGNOSIS — Z8614 Personal history of Methicillin resistant Staphylococcus aureus infection: Secondary | ICD-10-CM | POA: Diagnosis not present

## 2017-06-13 DIAGNOSIS — L97113 Non-pressure chronic ulcer of right thigh with necrosis of muscle: Secondary | ICD-10-CM | POA: Diagnosis not present

## 2017-06-13 DIAGNOSIS — L02415 Cutaneous abscess of right lower limb: Secondary | ICD-10-CM | POA: Insufficient documentation

## 2017-06-13 DIAGNOSIS — L988 Other specified disorders of the skin and subcutaneous tissue: Secondary | ICD-10-CM | POA: Diagnosis not present

## 2017-06-13 DIAGNOSIS — Z96641 Presence of right artificial hip joint: Secondary | ICD-10-CM | POA: Diagnosis not present

## 2017-06-13 DIAGNOSIS — I10 Essential (primary) hypertension: Secondary | ICD-10-CM | POA: Diagnosis not present

## 2017-06-13 DIAGNOSIS — S71101A Unspecified open wound, right thigh, initial encounter: Secondary | ICD-10-CM | POA: Diagnosis not present

## 2017-06-18 LAB — AEROBIC/ANAEROBIC CULTURE (SURGICAL/DEEP WOUND)

## 2017-06-18 LAB — AEROBIC/ANAEROBIC CULTURE W GRAM STAIN (SURGICAL/DEEP WOUND): Culture: NO GROWTH

## 2017-06-20 ENCOUNTER — Other Ambulatory Visit: Payer: Self-pay | Admitting: Surgery

## 2017-06-20 DIAGNOSIS — I10 Essential (primary) hypertension: Secondary | ICD-10-CM | POA: Diagnosis not present

## 2017-06-20 DIAGNOSIS — A4902 Methicillin resistant Staphylococcus aureus infection, unspecified site: Secondary | ICD-10-CM

## 2017-06-20 DIAGNOSIS — L97113 Non-pressure chronic ulcer of right thigh with necrosis of muscle: Secondary | ICD-10-CM

## 2017-06-20 DIAGNOSIS — S71101A Unspecified open wound, right thigh, initial encounter: Secondary | ICD-10-CM | POA: Diagnosis not present

## 2017-06-20 DIAGNOSIS — L988 Other specified disorders of the skin and subcutaneous tissue: Secondary | ICD-10-CM | POA: Diagnosis not present

## 2017-06-20 DIAGNOSIS — Z96641 Presence of right artificial hip joint: Secondary | ICD-10-CM | POA: Diagnosis not present

## 2017-06-20 DIAGNOSIS — L02415 Cutaneous abscess of right lower limb: Secondary | ICD-10-CM | POA: Diagnosis not present

## 2017-06-20 DIAGNOSIS — Z8614 Personal history of Methicillin resistant Staphylococcus aureus infection: Secondary | ICD-10-CM | POA: Diagnosis not present

## 2017-06-26 ENCOUNTER — Other Ambulatory Visit: Payer: Self-pay | Admitting: Surgery

## 2017-06-26 ENCOUNTER — Encounter (HOSPITAL_COMMUNITY): Payer: Self-pay | Admitting: Interventional Radiology

## 2017-06-26 ENCOUNTER — Ambulatory Visit (HOSPITAL_COMMUNITY)
Admission: RE | Admit: 2017-06-26 | Discharge: 2017-06-26 | Disposition: A | Discharge status: Home / Self Care | Payer: Medicare Other | Source: Ambulatory Visit | Attending: Surgery | Admitting: Surgery

## 2017-06-26 DIAGNOSIS — L988 Other specified disorders of the skin and subcutaneous tissue: Secondary | ICD-10-CM | POA: Diagnosis not present

## 2017-06-26 DIAGNOSIS — L02415 Cutaneous abscess of right lower limb: Secondary | ICD-10-CM

## 2017-06-26 DIAGNOSIS — Z4803 Encounter for change or removal of drains: Secondary | ICD-10-CM | POA: Diagnosis not present

## 2017-06-26 DIAGNOSIS — L97113 Non-pressure chronic ulcer of right thigh with necrosis of muscle: Secondary | ICD-10-CM

## 2017-06-26 DIAGNOSIS — A4902 Methicillin resistant Staphylococcus aureus infection, unspecified site: Secondary | ICD-10-CM

## 2017-06-26 DIAGNOSIS — T85898A Other specified complication of other internal prosthetic devices, implants and grafts, initial encounter: Secondary | ICD-10-CM | POA: Diagnosis not present

## 2017-06-26 HISTORY — PX: IR SINUS/FIST TUBE CHK-NON GI: IMG673

## 2017-06-26 MED ORDER — LIDOCAINE HCL (PF) 1 % IJ SOLN
INTRAMUSCULAR | Status: AC
  Filled 2017-06-26: qty 5

## 2017-06-26 MED ORDER — IOPAMIDOL (ISOVUE-300) INJECTION 61%
INTRAVENOUS | Status: AC
  Administered 2017-06-26: 20 mL
  Filled 2017-06-26: qty 50

## 2017-06-26 NOTE — Procedures (Signed)
Interventional Radiology Procedure Note  Procedure: fluoro guided fistula injection of the right posterior thigh.  Findings:  Soft tissue cavity in the posterior thigh.  No deep communication to the bone/periosteal region.    Complications: None  Recommendations:  - OK for DC - continue current care  Signed,  Yvone Neu. Loreta Ave, DO

## 2017-06-27 DIAGNOSIS — L02415 Cutaneous abscess of right lower limb: Secondary | ICD-10-CM | POA: Diagnosis not present

## 2017-06-27 DIAGNOSIS — I10 Essential (primary) hypertension: Secondary | ICD-10-CM | POA: Diagnosis not present

## 2017-06-27 DIAGNOSIS — Z96641 Presence of right artificial hip joint: Secondary | ICD-10-CM | POA: Diagnosis not present

## 2017-06-27 DIAGNOSIS — L97113 Non-pressure chronic ulcer of right thigh with necrosis of muscle: Secondary | ICD-10-CM | POA: Diagnosis not present

## 2017-06-27 DIAGNOSIS — L988 Other specified disorders of the skin and subcutaneous tissue: Secondary | ICD-10-CM | POA: Diagnosis not present

## 2017-06-27 DIAGNOSIS — T8189XA Other complications of procedures, not elsewhere classified, initial encounter: Secondary | ICD-10-CM | POA: Diagnosis not present

## 2017-06-27 DIAGNOSIS — Z8614 Personal history of Methicillin resistant Staphylococcus aureus infection: Secondary | ICD-10-CM | POA: Diagnosis not present

## 2017-07-01 DIAGNOSIS — L02415 Cutaneous abscess of right lower limb: Secondary | ICD-10-CM | POA: Diagnosis not present

## 2017-07-01 DIAGNOSIS — I10 Essential (primary) hypertension: Secondary | ICD-10-CM | POA: Diagnosis not present

## 2017-07-01 DIAGNOSIS — L97113 Non-pressure chronic ulcer of right thigh with necrosis of muscle: Secondary | ICD-10-CM | POA: Diagnosis not present

## 2017-07-01 DIAGNOSIS — L988 Other specified disorders of the skin and subcutaneous tissue: Secondary | ICD-10-CM | POA: Diagnosis not present

## 2017-07-01 DIAGNOSIS — Z96641 Presence of right artificial hip joint: Secondary | ICD-10-CM | POA: Diagnosis not present

## 2017-07-01 DIAGNOSIS — Z8614 Personal history of Methicillin resistant Staphylococcus aureus infection: Secondary | ICD-10-CM | POA: Diagnosis not present

## 2017-07-04 ENCOUNTER — Other Ambulatory Visit (HOSPITAL_COMMUNITY)
Admission: RE | Admit: 2017-07-04 | Discharge: 2017-07-04 | Disposition: A | Discharge status: Home / Self Care | Payer: Medicare Other | Source: Other Acute Inpatient Hospital | Attending: Internal Medicine | Admitting: Internal Medicine

## 2017-07-04 DIAGNOSIS — L97113 Non-pressure chronic ulcer of right thigh with necrosis of muscle: Secondary | ICD-10-CM | POA: Insufficient documentation

## 2017-07-04 DIAGNOSIS — Z96641 Presence of right artificial hip joint: Secondary | ICD-10-CM | POA: Diagnosis not present

## 2017-07-04 DIAGNOSIS — L02415 Cutaneous abscess of right lower limb: Secondary | ICD-10-CM | POA: Diagnosis not present

## 2017-07-04 DIAGNOSIS — Z8614 Personal history of Methicillin resistant Staphylococcus aureus infection: Secondary | ICD-10-CM | POA: Diagnosis not present

## 2017-07-04 DIAGNOSIS — L988 Other specified disorders of the skin and subcutaneous tissue: Secondary | ICD-10-CM | POA: Diagnosis not present

## 2017-07-04 DIAGNOSIS — I10 Essential (primary) hypertension: Secondary | ICD-10-CM | POA: Diagnosis not present

## 2017-07-07 LAB — AEROBIC CULTURE  (SUPERFICIAL SPECIMEN)

## 2017-07-07 LAB — AEROBIC CULTURE W GRAM STAIN (SUPERFICIAL SPECIMEN): Culture: NORMAL

## 2017-07-08 ENCOUNTER — Encounter (HOSPITAL_BASED_OUTPATIENT_CLINIC_OR_DEPARTMENT_OTHER): Payer: Medicare Other | Attending: Internal Medicine

## 2017-07-08 DIAGNOSIS — B954 Other streptococcus as the cause of diseases classified elsewhere: Secondary | ICD-10-CM | POA: Diagnosis not present

## 2017-07-08 DIAGNOSIS — L02415 Cutaneous abscess of right lower limb: Secondary | ICD-10-CM | POA: Diagnosis not present

## 2017-07-08 DIAGNOSIS — L98492 Non-pressure chronic ulcer of skin of other sites with fat layer exposed: Secondary | ICD-10-CM | POA: Insufficient documentation

## 2017-07-08 DIAGNOSIS — B9561 Methicillin susceptible Staphylococcus aureus infection as the cause of diseases classified elsewhere: Secondary | ICD-10-CM | POA: Diagnosis not present

## 2017-07-08 DIAGNOSIS — I10 Essential (primary) hypertension: Secondary | ICD-10-CM | POA: Insufficient documentation

## 2017-07-11 DIAGNOSIS — S71101A Unspecified open wound, right thigh, initial encounter: Secondary | ICD-10-CM | POA: Diagnosis not present

## 2017-07-11 DIAGNOSIS — L98492 Non-pressure chronic ulcer of skin of other sites with fat layer exposed: Secondary | ICD-10-CM | POA: Diagnosis not present

## 2017-07-11 DIAGNOSIS — L02415 Cutaneous abscess of right lower limb: Secondary | ICD-10-CM | POA: Diagnosis not present

## 2017-07-11 DIAGNOSIS — I10 Essential (primary) hypertension: Secondary | ICD-10-CM | POA: Diagnosis not present

## 2017-07-11 DIAGNOSIS — B954 Other streptococcus as the cause of diseases classified elsewhere: Secondary | ICD-10-CM | POA: Diagnosis not present

## 2017-07-11 DIAGNOSIS — B9561 Methicillin susceptible Staphylococcus aureus infection as the cause of diseases classified elsewhere: Secondary | ICD-10-CM | POA: Diagnosis not present

## 2017-07-13 ENCOUNTER — Encounter (HOSPITAL_COMMUNITY): Payer: Self-pay | Admitting: Emergency Medicine

## 2017-07-13 ENCOUNTER — Emergency Department (HOSPITAL_COMMUNITY)
Admission: EM | Admit: 2017-07-13 | Discharge: 2017-07-13 | Disposition: A | Discharge status: Home / Self Care | Payer: Medicare Other | Attending: Emergency Medicine | Admitting: Emergency Medicine

## 2017-07-13 DIAGNOSIS — L0291 Cutaneous abscess, unspecified: Secondary | ICD-10-CM

## 2017-07-13 DIAGNOSIS — Z7982 Long term (current) use of aspirin: Secondary | ICD-10-CM | POA: Diagnosis not present

## 2017-07-13 DIAGNOSIS — I10 Essential (primary) hypertension: Secondary | ICD-10-CM | POA: Insufficient documentation

## 2017-07-13 DIAGNOSIS — Z79899 Other long term (current) drug therapy: Secondary | ICD-10-CM | POA: Insufficient documentation

## 2017-07-13 DIAGNOSIS — Z96643 Presence of artificial hip joint, bilateral: Secondary | ICD-10-CM | POA: Diagnosis not present

## 2017-07-13 DIAGNOSIS — L02415 Cutaneous abscess of right lower limb: Secondary | ICD-10-CM | POA: Insufficient documentation

## 2017-07-13 HISTORY — DX: Carrier or suspected carrier of methicillin resistant Staphylococcus aureus: Z22.322

## 2017-07-13 LAB — I-STAT CG4 LACTIC ACID, ED
LACTIC ACID, VENOUS: 1.29 mmol/L (ref 0.5–1.9)
LACTIC ACID, VENOUS: 1.09 mmol/L (ref 0.5–1.9)

## 2017-07-13 LAB — URINALYSIS, ROUTINE W REFLEX MICROSCOPIC
BILIRUBIN URINE: NEGATIVE
Bacteria, UA: NONE SEEN
Glucose, UA: NEGATIVE mg/dL
HGB URINE DIPSTICK: NEGATIVE
Ketones, ur: 5 mg/dL — AB
Nitrite: NEGATIVE
PH: 5 (ref 5.0–8.0)
Protein, ur: NEGATIVE mg/dL
Specific Gravity, Urine: 1.018 (ref 1.005–1.030)

## 2017-07-13 LAB — CBC WITH DIFFERENTIAL/PLATELET
BASOS ABS: 0 10*3/uL (ref 0.0–0.1)
Basophils Relative: 0 %
EOS PCT: 3 %
Eosinophils Absolute: 0.3 10*3/uL (ref 0.0–0.7)
HCT: 40.8 % (ref 36.0–46.0)
HEMOGLOBIN: 14 g/dL (ref 12.0–15.0)
LYMPHS PCT: 17 %
Lymphs Abs: 1.9 10*3/uL (ref 0.7–4.0)
MCH: 28.6 pg (ref 26.0–34.0)
MCHC: 34.3 g/dL (ref 30.0–36.0)
MCV: 83.3 fL (ref 78.0–100.0)
Monocytes Absolute: 0.5 10*3/uL (ref 0.1–1.0)
Monocytes Relative: 4 %
NEUTROS ABS: 8 10*3/uL — AB (ref 1.7–7.7)
NEUTROS PCT: 76 %
PLATELETS: 270 10*3/uL (ref 150–400)
RBC: 4.9 MIL/uL (ref 3.87–5.11)
RDW: 13.1 % (ref 11.5–15.5)
WBC: 10.6 10*3/uL — AB (ref 4.0–10.5)

## 2017-07-13 LAB — COMPREHENSIVE METABOLIC PANEL
ALT: 13 U/L — ABNORMAL LOW (ref 14–54)
ANION GAP: 9 (ref 5–15)
AST: 21 U/L (ref 15–41)
Albumin: 4.3 g/dL (ref 3.5–5.0)
Alkaline Phosphatase: 84 U/L (ref 38–126)
BILIRUBIN TOTAL: 0.2 mg/dL — AB (ref 0.3–1.2)
BUN: 12 mg/dL (ref 6–20)
CO2: 27 mmol/L (ref 22–32)
Calcium: 9.5 mg/dL (ref 8.9–10.3)
Chloride: 104 mmol/L (ref 101–111)
Creatinine, Ser: 0.62 mg/dL (ref 0.44–1.00)
Glucose, Bld: 123 mg/dL — ABNORMAL HIGH (ref 65–99)
POTASSIUM: 3.5 mmol/L (ref 3.5–5.1)
Sodium: 140 mmol/L (ref 135–145)
TOTAL PROTEIN: 8 g/dL (ref 6.5–8.1)

## 2017-07-13 MED ORDER — LINEZOLID 600 MG PO TABS
600.0000 mg | ORAL_TABLET | Freq: Once | ORAL | Status: AC
  Administered 2017-07-13: 600 mg via ORAL
  Filled 2017-07-13: qty 1

## 2017-07-13 MED ORDER — LINEZOLID 600 MG PO TABS: 600.0000 mg | ORAL_TABLET | Freq: Two times a day (BID) | ORAL | 0 refills | Status: DC

## 2017-07-13 MED ORDER — LINEZOLID 600 MG PO TABS: 600.0000 mg | ORAL_TABLET | Freq: Two times a day (BID) | ORAL | Status: DC

## 2017-07-13 NOTE — Discharge Instructions (Signed)
Will restart antibiotic. Strongly recommend wound care center early in week. You may need a surgical procedure to further evaluate this wound.

## 2017-07-13 NOTE — ED Triage Notes (Signed)
Pt c/o sudden foul odor to R hip wound that began today. Pt states she was alarmed by the odor. Hx: Pt diagnosed with MRSA in March, wound in R hip, culture positive for MRSA, pt treated with Clindamycin and Doxycycline. Pt has had 2 rounds of Zyvox and 4 recent cultures were negative on first and second pocket of fluid in R hip. Pt has used a wound vac x 2.5 weeks, and states drainage color and consistency has improved over the past 2.5 weeks without odor. Pt also reports new tunneling over the past week and is now open.

## 2017-07-13 NOTE — ED Provider Notes (Signed)
[facility]-EMERGENCY DEPT Provider Note   CSN: [id number] Arrival date & time: 07/13/17  1342     History   Chief Complaint Chief Complaint  Patient presents with  . Wound Infection    HPI Janet Mcdonald is a 71 y.o. female.  Patient has a long-standing infection in the soft tissue over the right lateral hip since March 2018 which was identified as MRSA. She has been on multiple rounds of antibiotics including clindamycin, doxycycline  9 rounds, and finally Zyvox.  She currently goes to the [facility] wound care center. She has had a wound VAC in place for approximately 2 and 1/2 weeks. Today she noticed a significant odor from the wound site. It is draining a grayish discharge. severity of symptoms is moderate. She otherwise feels well. No fever, sweats, chills.  She has an appointment with a general surgeon in approximately one month for a potential incision and drainage.      Past Medical History:  Diagnosis Date  . Hypertension   . MRSA (methicillin resistant staph aureus) culture positive     There are no active problems to display for this patient.   Past Surgical History:  Procedure Laterality Date  . ABDOMINAL HYSTERECTOMY    . APPENDECTOMY    . Bilateral hip replacment    . IR SINUS/FIST TUBE CHK-NON GI  06/26/2017    OB History    No data available       Home Medications    Prior to Admission medications   Medication Sig Start Date End Date Taking? Authorizing Provider  aspirin EC 325 MG tablet Take 325 mg by mouth daily.    [provider]  calcium carbonate (TUMS - DOSED IN MG ELEMENTAL CALCIUM) 500 MG chewable tablet Chew 1 tablet by mouth daily.    [provider]  Cinnamon 500 MG capsule Take 1,000 mg by mouth 2 (two) times daily.    [provider]  FLUZONE HIGH-DOSE 0.5 ML SUSY Inject 1 Dose as directed once. 08/15/15   [provider]  guaiFENesin (MUCINEX) 600 MG 12 hr tablet Take 600 mg by mouth daily as  needed for cough.    [provider]  HYDROcodone-acetaminophen (NORCO/VICODIN) 5-325 MG tablet Take 1-2 tablets by mouth every 4 (four) hours as needed for moderate pain or severe pain. 08/27/16   [name], PA-C  linezolid (ZYVOX) 600 MG tablet Take 1 tablet (600 mg total) by mouth 2 (two) times daily. 07/13/17   Donnetta Hutching, MD  lisinopril-hydrochlorothiazide (PRINZIDE,ZESTORETIC) 20-25 MG per tablet Take 1 tablet by mouth every morning.    [provider]  metoprolol (LOPRESSOR) 100 MG tablet Take 100 mg by mouth 2 (two) times daily.    [provider]  Multiple Vitamin (MULTIVITAMIN WITH MINERALS) TABS tablet Take 1 tablet by mouth daily.    [provider]  naproxen sodium (ANAPROX) 220 MG tablet Take 220 mg by mouth daily as needed (aches).     [provider]  PREVNAR 13 SUSP injection Inject 1 Dose as directed once. 08/03/15   [provider]  Turmeric 500 MG CAPS Take 1 capsule by mouth 2 (two) times daily.    [provider]    Family History History reviewed. No pertinent family history.  Social History Social History  Substance Use Topics  . Smoking status: Never Smoker  . Smokeless tobacco: Never Used  . Alcohol use No     Allergies   Oxycodone and Tetracyclines & related  Review of Systems Review of Systems  All other systems reviewed and are negative.    Physical Exam Updated Vital Signs BP (!) 160/89 (BP Location: Left Arm)   Pulse 78   Temp 98 F (36.7 C) (Oral)   Resp 17   Ht 5\' 4"  (1.626 m)   Wt 69.9 kg (154 lb)   SpO2 99%   BMI 26.43 kg/m   Physical Exam  Constitutional: She is oriented to person, place, and time. She appears well-developed and well-nourished.  HENT:  Head: Normocephalic and atraumatic.  Eyes: Conjunctivae are normal.  Neck: Neck supple.  Cardiovascular: Normal rate and regular rhythm.   Pulmonary/Chest: Effort normal and breath sounds normal.  Abdominal: Soft.  Bowel sounds are normal.  Musculoskeletal: Normal range of motion.  Neurological: She is alert and oriented to person, place, and time.  Skin:  Right lateral hip;  Patient has 2 open spots on the soft tissue each approximately 2 cm in diameter. There is a grayish purulent discharge from the wound.  Psychiatric: She has a normal mood and affect. Her behavior is normal.  Nursing note and vitals reviewed.    ED Treatments / Results  Labs (all labs ordered are listed, but only abnormal results are displayed) Labs Reviewed  COMPREHENSIVE METABOLIC PANEL - Abnormal; Notable for the following:       Result Value   Glucose, Bld 123 (*)    ALT 13 (*)    Total Bilirubin 0.2 (*)    All other components within normal limits  CBC WITH DIFFERENTIAL/PLATELET - Abnormal; Notable for the following:    WBC 10.6 (*)    Neutro Abs 8.0 (*)    All other components within normal limits  URINALYSIS, ROUTINE W REFLEX MICROSCOPIC - Abnormal; Notable for the following:    APPearance HAZY (*)    Ketones, ur 5 (*)    Leukocytes, UA LARGE (*)    Squamous Epithelial / LPF 0-5 (*)    Non Squamous Epithelial 0-5 (*)    All other components within normal limits  AEROBIC CULTURE (SUPERFICIAL SPECIMEN)  I-STAT CG4 LACTIC ACID, ED  I-STAT CG4 LACTIC ACID, ED    EKG  EKG Interpretation None       Radiology No results found.  Procedures Procedures (including critical care time)  Medications Ordered in ED Medications  linezolid (ZYVOX) tablet 600 mg (not administered)     Initial Impression / Assessment and Plan / ED Course  I have reviewed the triage vital signs and the nursing notes.  Pertinent labs & imaging results that were available during my care of the patient were reviewed by me and considered in my medical decision making (see chart for details).     Patient is not septic and is nontoxic-appearing. Will restart Zyvox. Encouraged her to go to the wound care center early in the  week.  Final Clinical Impressions(s) / ED Diagnoses   Final diagnoses:  Abscess    New Prescriptions New Prescriptions   LINEZOLID (ZYVOX) 600 MG TABLET    Take 1 tablet (600 mg total) by mouth 2 (two) times daily.     Donnetta Hutchingook, , MD 07/13/17 2126

## 2017-07-13 NOTE — ED Notes (Signed)
Pt called for room, no response from lobby 

## 2017-07-14 DIAGNOSIS — L98492 Non-pressure chronic ulcer of skin of other sites with fat layer exposed: Secondary | ICD-10-CM | POA: Diagnosis not present

## 2017-07-14 DIAGNOSIS — L02415 Cutaneous abscess of right lower limb: Secondary | ICD-10-CM | POA: Diagnosis not present

## 2017-07-14 DIAGNOSIS — B9561 Methicillin susceptible Staphylococcus aureus infection as the cause of diseases classified elsewhere: Secondary | ICD-10-CM | POA: Diagnosis not present

## 2017-07-14 DIAGNOSIS — B954 Other streptococcus as the cause of diseases classified elsewhere: Secondary | ICD-10-CM | POA: Diagnosis not present

## 2017-07-14 DIAGNOSIS — I10 Essential (primary) hypertension: Secondary | ICD-10-CM | POA: Diagnosis not present

## 2017-07-17 LAB — AEROBIC CULTURE  (SUPERFICIAL SPECIMEN): SPECIAL REQUESTS: NORMAL

## 2017-07-17 LAB — AEROBIC CULTURE W GRAM STAIN (SUPERFICIAL SPECIMEN)

## 2017-07-18 DIAGNOSIS — B9561 Methicillin susceptible Staphylococcus aureus infection as the cause of diseases classified elsewhere: Secondary | ICD-10-CM | POA: Diagnosis not present

## 2017-07-18 DIAGNOSIS — R21 Rash and other nonspecific skin eruption: Secondary | ICD-10-CM | POA: Diagnosis not present

## 2017-07-18 DIAGNOSIS — L98492 Non-pressure chronic ulcer of skin of other sites with fat layer exposed: Secondary | ICD-10-CM | POA: Diagnosis not present

## 2017-07-18 DIAGNOSIS — B954 Other streptococcus as the cause of diseases classified elsewhere: Secondary | ICD-10-CM | POA: Diagnosis not present

## 2017-07-18 DIAGNOSIS — B9562 Methicillin resistant Staphylococcus aureus infection as the cause of diseases classified elsewhere: Secondary | ICD-10-CM | POA: Diagnosis not present

## 2017-07-18 DIAGNOSIS — I10 Essential (primary) hypertension: Secondary | ICD-10-CM | POA: Diagnosis not present

## 2017-07-18 DIAGNOSIS — L02415 Cutaneous abscess of right lower limb: Secondary | ICD-10-CM | POA: Diagnosis not present

## 2017-07-20 ENCOUNTER — Telehealth: Payer: Self-pay | Admitting: Emergency Medicine

## 2017-07-20 NOTE — Telephone Encounter (Signed)
Post ED Visit - Positive Culture Follow-up  Culture report reviewed by antimicrobial stewardship pharmacist:   Enzo Bi, Pharm.D.  Celedonio Miyamoto, Pharm.D., BCPS AQ-ID  Garvin Fila, Pharm.D., BCPS  Georgina Pillion, [address].D., BCPS  [place], [address]D., BCPS, AAHIVP  Estella Husk, Pharm.D., BCPS, AAHIVP  Lysle Pearl, PharmD, BCPS  Casilda Carls, PharmD, BCPS  Pollyann Samples, PharmD, BCPS  Positive wound culture Treated with linezolid, organism sensitive to the same and no further patient follow-up is required at this time.  Berle Mull 07/20/2017, 7:39 AM

## 2017-07-22 DIAGNOSIS — I10 Essential (primary) hypertension: Secondary | ICD-10-CM | POA: Diagnosis not present

## 2017-07-22 DIAGNOSIS — L98492 Non-pressure chronic ulcer of skin of other sites with fat layer exposed: Secondary | ICD-10-CM | POA: Diagnosis not present

## 2017-07-22 DIAGNOSIS — L02415 Cutaneous abscess of right lower limb: Secondary | ICD-10-CM | POA: Diagnosis not present

## 2017-07-22 DIAGNOSIS — B9561 Methicillin susceptible Staphylococcus aureus infection as the cause of diseases classified elsewhere: Secondary | ICD-10-CM | POA: Diagnosis not present

## 2017-07-22 DIAGNOSIS — B954 Other streptococcus as the cause of diseases classified elsewhere: Secondary | ICD-10-CM | POA: Diagnosis not present

## 2017-07-25 DIAGNOSIS — S71101A Unspecified open wound, right thigh, initial encounter: Secondary | ICD-10-CM | POA: Diagnosis not present

## 2017-07-25 DIAGNOSIS — B9561 Methicillin susceptible Staphylococcus aureus infection as the cause of diseases classified elsewhere: Secondary | ICD-10-CM | POA: Diagnosis not present

## 2017-07-25 DIAGNOSIS — L98492 Non-pressure chronic ulcer of skin of other sites with fat layer exposed: Secondary | ICD-10-CM | POA: Diagnosis not present

## 2017-07-25 DIAGNOSIS — I10 Essential (primary) hypertension: Secondary | ICD-10-CM | POA: Diagnosis not present

## 2017-07-25 DIAGNOSIS — B954 Other streptococcus as the cause of diseases classified elsewhere: Secondary | ICD-10-CM | POA: Diagnosis not present

## 2017-07-25 DIAGNOSIS — L02415 Cutaneous abscess of right lower limb: Secondary | ICD-10-CM | POA: Diagnosis not present

## 2017-07-29 DIAGNOSIS — I10 Essential (primary) hypertension: Secondary | ICD-10-CM | POA: Diagnosis not present

## 2017-07-29 DIAGNOSIS — B9561 Methicillin susceptible Staphylococcus aureus infection as the cause of diseases classified elsewhere: Secondary | ICD-10-CM | POA: Diagnosis not present

## 2017-07-29 DIAGNOSIS — B954 Other streptococcus as the cause of diseases classified elsewhere: Secondary | ICD-10-CM | POA: Diagnosis not present

## 2017-07-29 DIAGNOSIS — L98492 Non-pressure chronic ulcer of skin of other sites with fat layer exposed: Secondary | ICD-10-CM | POA: Diagnosis not present

## 2017-07-29 DIAGNOSIS — L02415 Cutaneous abscess of right lower limb: Secondary | ICD-10-CM | POA: Diagnosis not present

## 2017-08-01 DIAGNOSIS — I10 Essential (primary) hypertension: Secondary | ICD-10-CM | POA: Diagnosis not present

## 2017-08-01 DIAGNOSIS — S71101A Unspecified open wound, right thigh, initial encounter: Secondary | ICD-10-CM | POA: Diagnosis not present

## 2017-08-01 DIAGNOSIS — L02415 Cutaneous abscess of right lower limb: Secondary | ICD-10-CM | POA: Diagnosis not present

## 2017-08-01 DIAGNOSIS — B9561 Methicillin susceptible Staphylococcus aureus infection as the cause of diseases classified elsewhere: Secondary | ICD-10-CM | POA: Diagnosis not present

## 2017-08-01 DIAGNOSIS — L98492 Non-pressure chronic ulcer of skin of other sites with fat layer exposed: Secondary | ICD-10-CM | POA: Diagnosis not present

## 2017-08-01 DIAGNOSIS — B954 Other streptococcus as the cause of diseases classified elsewhere: Secondary | ICD-10-CM | POA: Diagnosis not present

## 2017-08-05 ENCOUNTER — Encounter (HOSPITAL_BASED_OUTPATIENT_CLINIC_OR_DEPARTMENT_OTHER): Payer: Medicare Other | Attending: Surgery

## 2017-08-05 DIAGNOSIS — L02415 Cutaneous abscess of right lower limb: Secondary | ICD-10-CM | POA: Diagnosis not present

## 2017-08-05 DIAGNOSIS — L97112 Non-pressure chronic ulcer of right thigh with fat layer exposed: Secondary | ICD-10-CM | POA: Diagnosis not present

## 2017-08-05 DIAGNOSIS — Z96641 Presence of right artificial hip joint: Secondary | ICD-10-CM | POA: Insufficient documentation

## 2017-08-05 DIAGNOSIS — I10 Essential (primary) hypertension: Secondary | ICD-10-CM | POA: Insufficient documentation

## 2017-08-07 DIAGNOSIS — S81801A Unspecified open wound, right lower leg, initial encounter: Secondary | ICD-10-CM | POA: Diagnosis not present

## 2017-08-11 DIAGNOSIS — S71101A Unspecified open wound, right thigh, initial encounter: Secondary | ICD-10-CM | POA: Diagnosis not present

## 2017-08-12 DIAGNOSIS — Z23 Encounter for immunization: Secondary | ICD-10-CM | POA: Diagnosis not present

## 2017-08-15 DIAGNOSIS — L02415 Cutaneous abscess of right lower limb: Secondary | ICD-10-CM | POA: Diagnosis not present

## 2017-08-15 DIAGNOSIS — Z96641 Presence of right artificial hip joint: Secondary | ICD-10-CM | POA: Diagnosis not present

## 2017-08-15 DIAGNOSIS — L97112 Non-pressure chronic ulcer of right thigh with fat layer exposed: Secondary | ICD-10-CM | POA: Diagnosis not present

## 2017-08-15 DIAGNOSIS — S71101A Unspecified open wound, right thigh, initial encounter: Secondary | ICD-10-CM | POA: Diagnosis not present

## 2017-08-15 DIAGNOSIS — I10 Essential (primary) hypertension: Secondary | ICD-10-CM | POA: Diagnosis not present

## 2017-08-16 DIAGNOSIS — S71101A Unspecified open wound, right thigh, initial encounter: Secondary | ICD-10-CM | POA: Diagnosis not present

## 2017-08-19 DIAGNOSIS — L02415 Cutaneous abscess of right lower limb: Secondary | ICD-10-CM | POA: Diagnosis not present

## 2017-08-19 DIAGNOSIS — Z96641 Presence of right artificial hip joint: Secondary | ICD-10-CM | POA: Diagnosis not present

## 2017-08-19 DIAGNOSIS — I10 Essential (primary) hypertension: Secondary | ICD-10-CM | POA: Diagnosis not present

## 2017-08-19 DIAGNOSIS — L97112 Non-pressure chronic ulcer of right thigh with fat layer exposed: Secondary | ICD-10-CM | POA: Diagnosis not present

## 2017-08-21 DIAGNOSIS — Z96641 Presence of right artificial hip joint: Secondary | ICD-10-CM | POA: Diagnosis not present

## 2017-08-21 DIAGNOSIS — S71101A Unspecified open wound, right thigh, initial encounter: Secondary | ICD-10-CM | POA: Diagnosis not present

## 2017-08-22 DIAGNOSIS — B9562 Methicillin resistant Staphylococcus aureus infection as the cause of diseases classified elsewhere: Secondary | ICD-10-CM | POA: Diagnosis not present

## 2017-08-22 DIAGNOSIS — I10 Essential (primary) hypertension: Secondary | ICD-10-CM | POA: Diagnosis not present

## 2017-08-22 DIAGNOSIS — S71101A Unspecified open wound, right thigh, initial encounter: Secondary | ICD-10-CM | POA: Diagnosis not present

## 2017-08-22 DIAGNOSIS — Z96641 Presence of right artificial hip joint: Secondary | ICD-10-CM | POA: Diagnosis not present

## 2017-08-22 DIAGNOSIS — L02415 Cutaneous abscess of right lower limb: Secondary | ICD-10-CM | POA: Diagnosis not present

## 2017-08-22 DIAGNOSIS — L97112 Non-pressure chronic ulcer of right thigh with fat layer exposed: Secondary | ICD-10-CM | POA: Diagnosis not present

## 2017-08-26 DIAGNOSIS — I10 Essential (primary) hypertension: Secondary | ICD-10-CM | POA: Diagnosis not present

## 2017-08-26 DIAGNOSIS — L97112 Non-pressure chronic ulcer of right thigh with fat layer exposed: Secondary | ICD-10-CM | POA: Diagnosis not present

## 2017-08-26 DIAGNOSIS — L02415 Cutaneous abscess of right lower limb: Secondary | ICD-10-CM | POA: Diagnosis not present

## 2017-08-26 DIAGNOSIS — Z96641 Presence of right artificial hip joint: Secondary | ICD-10-CM | POA: Diagnosis not present

## 2017-08-29 DIAGNOSIS — Z96641 Presence of right artificial hip joint: Secondary | ICD-10-CM | POA: Diagnosis not present

## 2017-08-29 DIAGNOSIS — I10 Essential (primary) hypertension: Secondary | ICD-10-CM | POA: Diagnosis not present

## 2017-08-29 DIAGNOSIS — L02415 Cutaneous abscess of right lower limb: Secondary | ICD-10-CM | POA: Diagnosis not present

## 2017-08-29 DIAGNOSIS — L97112 Non-pressure chronic ulcer of right thigh with fat layer exposed: Secondary | ICD-10-CM | POA: Diagnosis not present

## 2017-08-29 DIAGNOSIS — L928 Other granulomatous disorders of the skin and subcutaneous tissue: Secondary | ICD-10-CM | POA: Diagnosis not present

## 2017-09-02 DIAGNOSIS — I10 Essential (primary) hypertension: Secondary | ICD-10-CM | POA: Diagnosis not present

## 2017-09-02 DIAGNOSIS — L02415 Cutaneous abscess of right lower limb: Secondary | ICD-10-CM | POA: Diagnosis not present

## 2017-09-02 DIAGNOSIS — L97112 Non-pressure chronic ulcer of right thigh with fat layer exposed: Secondary | ICD-10-CM | POA: Diagnosis not present

## 2017-09-02 DIAGNOSIS — Z96641 Presence of right artificial hip joint: Secondary | ICD-10-CM | POA: Diagnosis not present

## 2017-09-05 ENCOUNTER — Encounter (HOSPITAL_BASED_OUTPATIENT_CLINIC_OR_DEPARTMENT_OTHER): Payer: Medicare Other | Attending: Internal Medicine

## 2017-09-05 ENCOUNTER — Other Ambulatory Visit (HOSPITAL_COMMUNITY)
Admission: RE | Admit: 2017-09-05 | Discharge: 2017-09-05 | Disposition: A | Discharge status: Home / Self Care | Payer: Medicare Other | Source: Other Acute Inpatient Hospital | Attending: Internal Medicine | Admitting: Internal Medicine

## 2017-09-05 DIAGNOSIS — I10 Essential (primary) hypertension: Secondary | ICD-10-CM | POA: Diagnosis not present

## 2017-09-05 DIAGNOSIS — L98492 Non-pressure chronic ulcer of skin of other sites with fat layer exposed: Secondary | ICD-10-CM | POA: Diagnosis not present

## 2017-09-05 DIAGNOSIS — S71101A Unspecified open wound, right thigh, initial encounter: Secondary | ICD-10-CM | POA: Diagnosis not present

## 2017-09-05 DIAGNOSIS — L97113 Non-pressure chronic ulcer of right thigh with necrosis of muscle: Secondary | ICD-10-CM | POA: Diagnosis not present

## 2017-09-05 DIAGNOSIS — L02415 Cutaneous abscess of right lower limb: Secondary | ICD-10-CM | POA: Insufficient documentation

## 2017-09-08 LAB — AEROBIC CULTURE W GRAM STAIN (SUPERFICIAL SPECIMEN): Culture: NORMAL

## 2017-09-08 LAB — AEROBIC CULTURE  (SUPERFICIAL SPECIMEN)

## 2017-09-11 DIAGNOSIS — Z96641 Presence of right artificial hip joint: Secondary | ICD-10-CM | POA: Diagnosis not present

## 2017-09-11 DIAGNOSIS — S71101D Unspecified open wound, right thigh, subsequent encounter: Secondary | ICD-10-CM | POA: Diagnosis not present

## 2017-09-12 DIAGNOSIS — I10 Essential (primary) hypertension: Secondary | ICD-10-CM | POA: Diagnosis not present

## 2017-09-12 DIAGNOSIS — L98492 Non-pressure chronic ulcer of skin of other sites with fat layer exposed: Secondary | ICD-10-CM | POA: Diagnosis not present

## 2017-09-12 DIAGNOSIS — L02415 Cutaneous abscess of right lower limb: Secondary | ICD-10-CM | POA: Diagnosis not present

## 2017-09-16 DIAGNOSIS — S71101D Unspecified open wound, right thigh, subsequent encounter: Secondary | ICD-10-CM | POA: Diagnosis not present

## 2017-09-16 DIAGNOSIS — Z96641 Presence of right artificial hip joint: Secondary | ICD-10-CM | POA: Diagnosis not present

## 2017-09-19 DIAGNOSIS — L98492 Non-pressure chronic ulcer of skin of other sites with fat layer exposed: Secondary | ICD-10-CM | POA: Diagnosis not present

## 2017-09-19 DIAGNOSIS — L02415 Cutaneous abscess of right lower limb: Secondary | ICD-10-CM | POA: Diagnosis not present

## 2017-09-19 DIAGNOSIS — I10 Essential (primary) hypertension: Secondary | ICD-10-CM | POA: Diagnosis not present

## 2017-09-23 DIAGNOSIS — S71101D Unspecified open wound, right thigh, subsequent encounter: Secondary | ICD-10-CM | POA: Diagnosis not present

## 2017-09-23 DIAGNOSIS — S71109A Unspecified open wound, unspecified thigh, initial encounter: Secondary | ICD-10-CM | POA: Diagnosis not present

## 2017-09-30 DIAGNOSIS — S71101D Unspecified open wound, right thigh, subsequent encounter: Secondary | ICD-10-CM | POA: Diagnosis not present

## 2017-10-01 IMAGING — US US EXTREM LOW*R* LIMITED
1 series · 14 of 25 positions shown · non-contrast
Comparison: None

CLINICAL DATA: MRSA infection posterior RIGHT thigh question
abscess

EXAM:
ULTRASOUND RIGHT LOWER EXTREMITY LIMITED
TECHNIQUE: Ultrasound examination of the lower extremity soft tissues was
performed in the area of clinical concern at the posterior RIGHT
thigh.

[Series 1: us extrem low*right* limited · 0.06mm/px · 39 acquisitions, 14 frames shown]
[im 1/39]
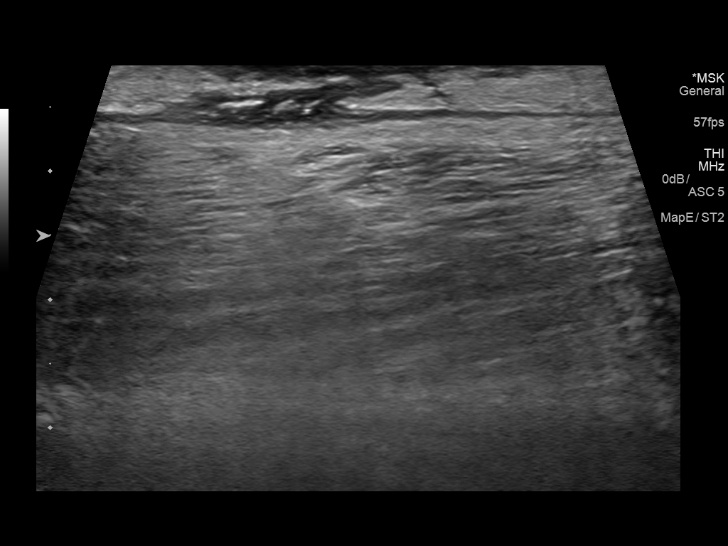
[im 4/39]
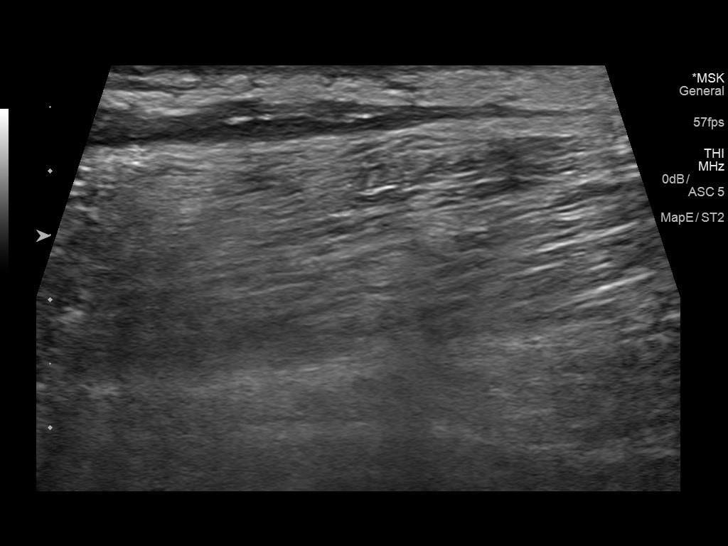
[im 7/39]
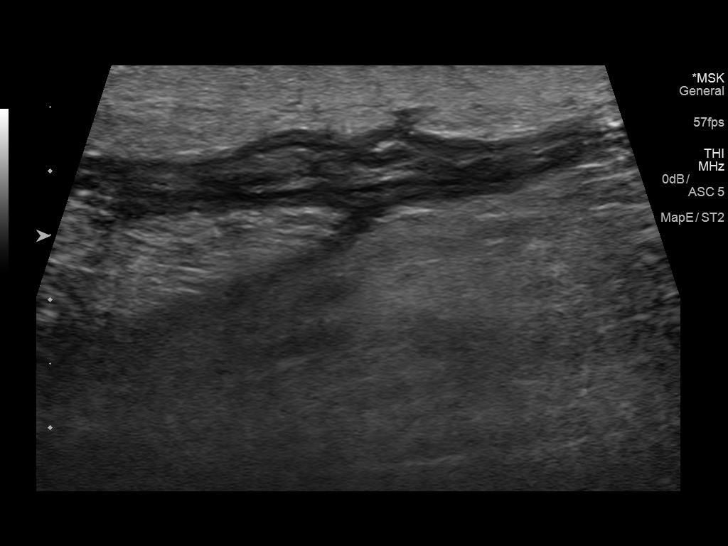
[im 10/39]
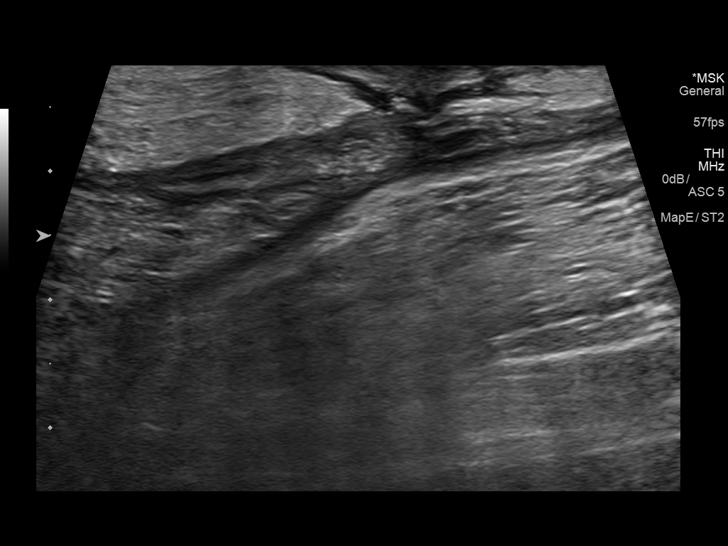
[im 13/39]
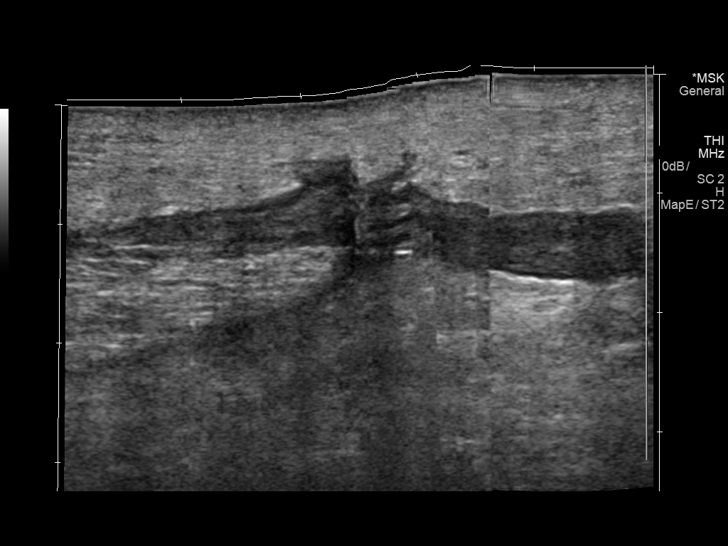
[im 15/39]
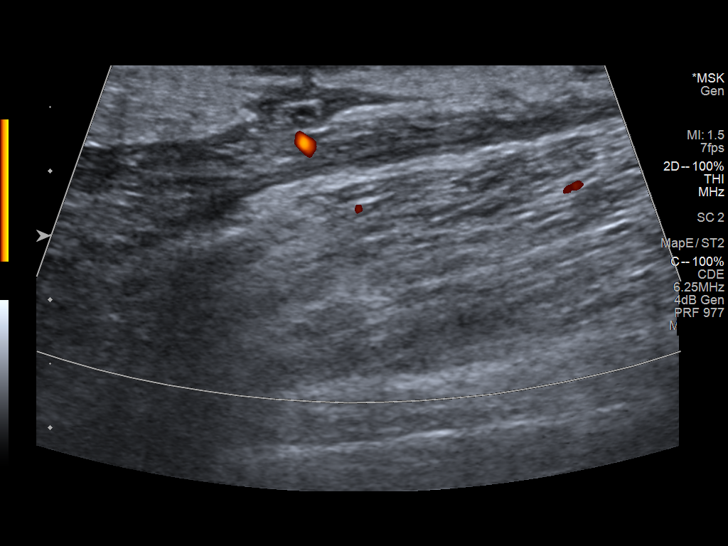
[im 18/39]
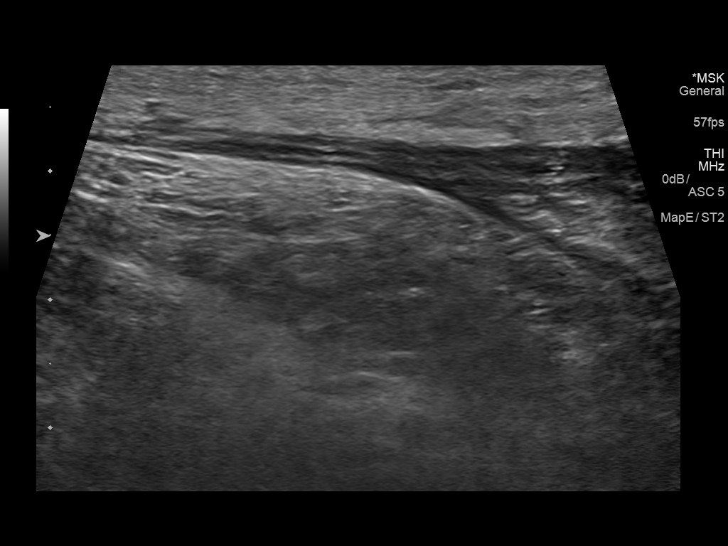
[im 21/39]
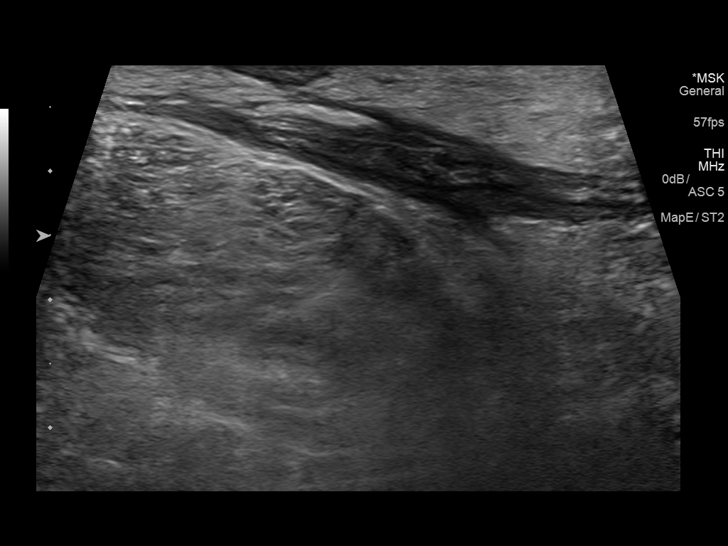
[im 24/39]
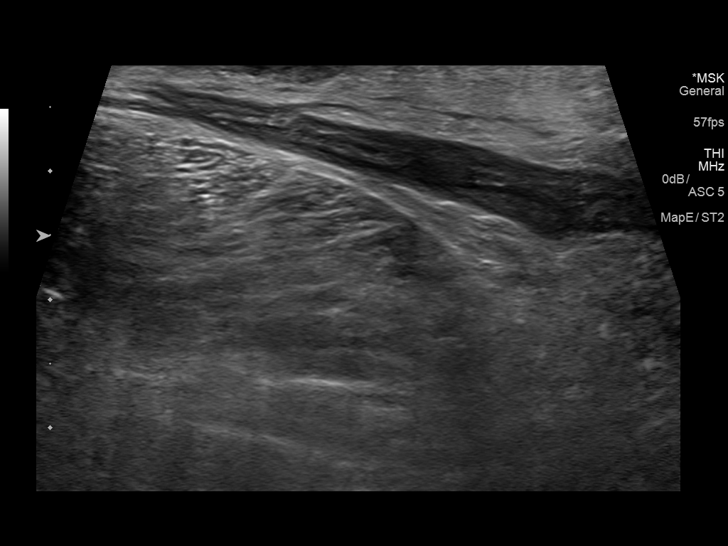
[im 26/39]
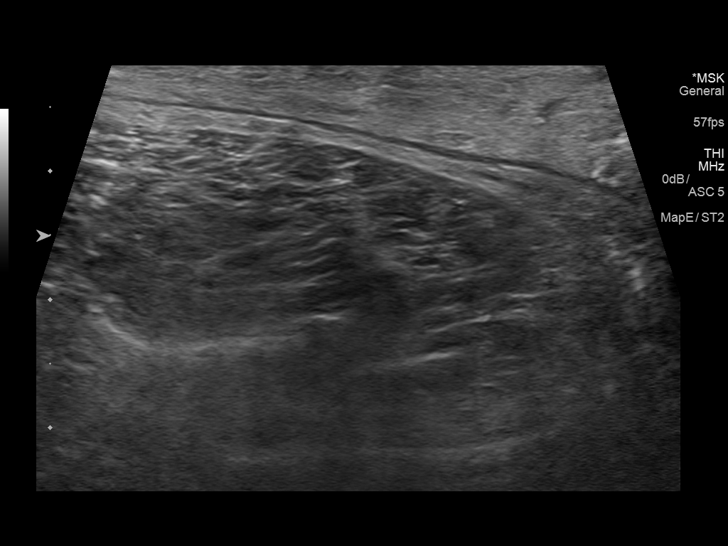
[im 29/39]
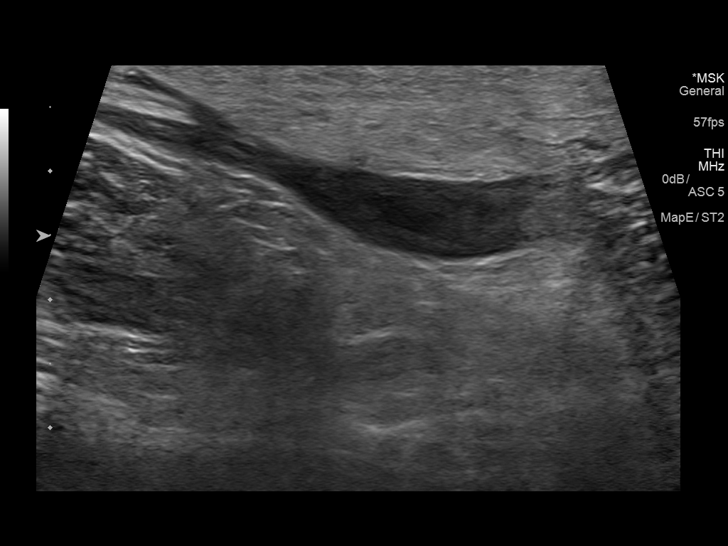
[im 32/39]
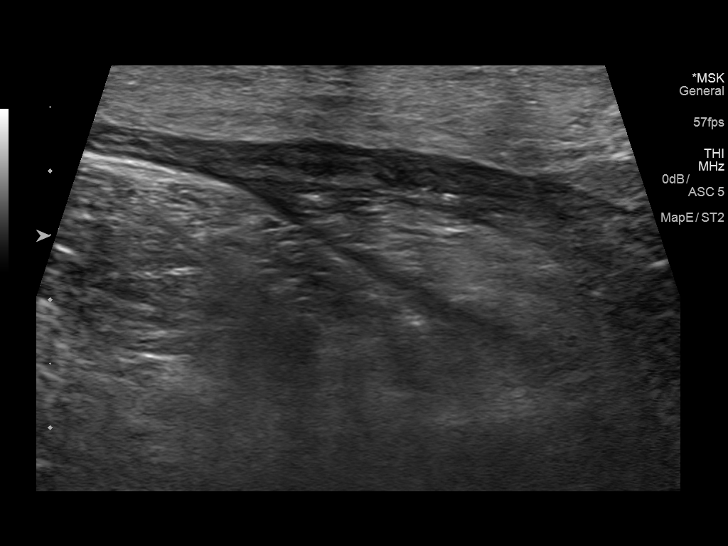
[im 35/39]
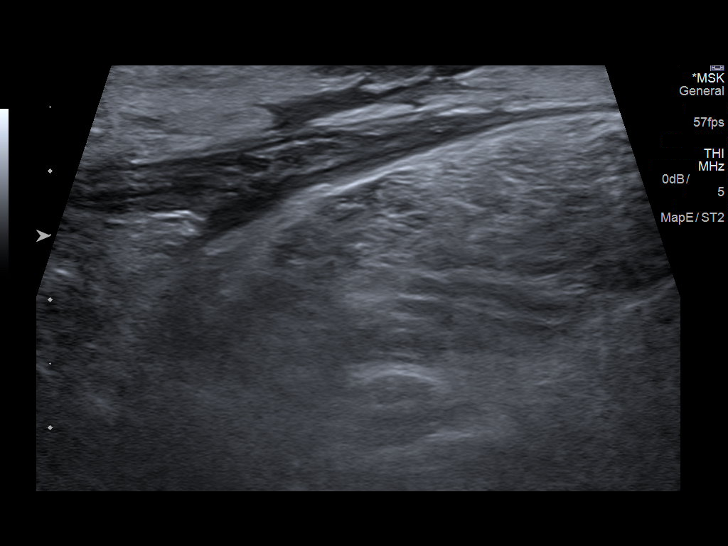
[im 39/39]
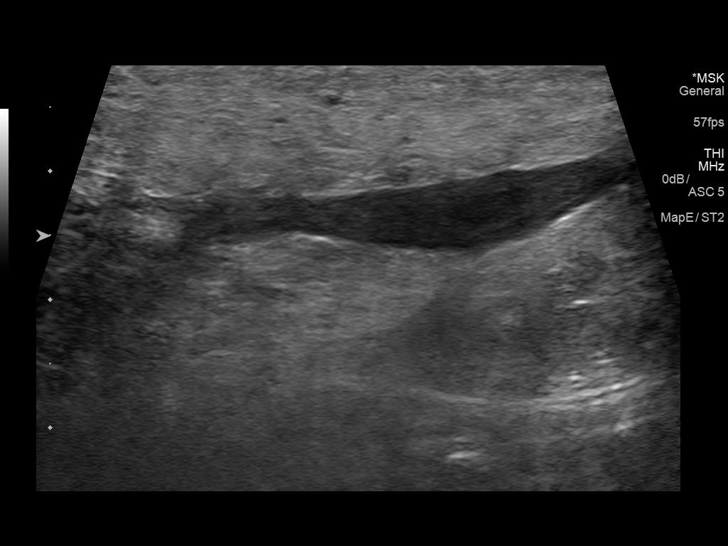

[14 of 25 positions shown; findings below may reference images not displayed]

FINDINGS: Joint Space: N/A

Muscles: N/A

Tendons: N/A

Other Soft Tissue Structures: At the site of clinical concern, a
complex hypoechoic collection is identified which measures 5.9 x
cm in greatest axial dimensions and measures 8 mm thick. Area shows
minimal vascularity on color Doppler imaging. Collection is located
slightly lateral to the open wound. Observed collection appears to
be extra-fascial.
IMPRESSION: Complex hypoechoic collection within the subcutaneous soft tissue
lateral to the open wound at the posterior RIGHT thigh measuring
x 3.9 x 0.8 cm in size, question subcutaneous abscess though a
hematoma could have a similar appearance.

## 2017-10-03 DIAGNOSIS — S71101A Unspecified open wound, right thigh, initial encounter: Secondary | ICD-10-CM | POA: Diagnosis not present

## 2017-10-03 DIAGNOSIS — B9561 Methicillin susceptible Staphylococcus aureus infection as the cause of diseases classified elsewhere: Secondary | ICD-10-CM | POA: Diagnosis not present

## 2017-10-03 DIAGNOSIS — L98492 Non-pressure chronic ulcer of skin of other sites with fat layer exposed: Secondary | ICD-10-CM | POA: Diagnosis not present

## 2017-10-03 DIAGNOSIS — I10 Essential (primary) hypertension: Secondary | ICD-10-CM | POA: Diagnosis not present

## 2017-10-04 DIAGNOSIS — S71101D Unspecified open wound, right thigh, subsequent encounter: Secondary | ICD-10-CM | POA: Diagnosis not present

## 2017-10-18 DIAGNOSIS — S71101D Unspecified open wound, right thigh, subsequent encounter: Secondary | ICD-10-CM | POA: Diagnosis not present

## 2017-11-01 DIAGNOSIS — S71101D Unspecified open wound, right thigh, subsequent encounter: Secondary | ICD-10-CM | POA: Diagnosis not present

## 2017-11-17 DIAGNOSIS — S71101A Unspecified open wound, right thigh, initial encounter: Secondary | ICD-10-CM | POA: Diagnosis not present

## 2017-12-01 DIAGNOSIS — S71101D Unspecified open wound, right thigh, subsequent encounter: Secondary | ICD-10-CM | POA: Diagnosis not present

## 2017-12-01 DIAGNOSIS — S71101S Unspecified open wound, right thigh, sequela: Secondary | ICD-10-CM | POA: Diagnosis not present

## 2017-12-01 IMAGING — XA IR FISTULA/SINUS TRACT
1 series · 10 of 10 positions shown · non-contrast
Comparison: CT 04/10/2017

INDICATION: 70-year-old female with a chronic draining wound of the posterior
right thigh. Prior ultrasound-guided aspiration has yielded a
sterile result.

The patient has been referred for attempted characterization of the
sinus tract, to rule out communication to the underlying surgical
site.
EXAM:
SINUS TRACT INJECTION / FISTULOGRAM
TECHNIQUE: Informed written consent was obtained from the patient after a
thorough discussion of the procedural risks, benefits and
alternatives. All questions were addressed. Clean technique was used
for the procedure.

[Series 300: dsa extremities · 10 of 10 slices shown]
[im 1/10]
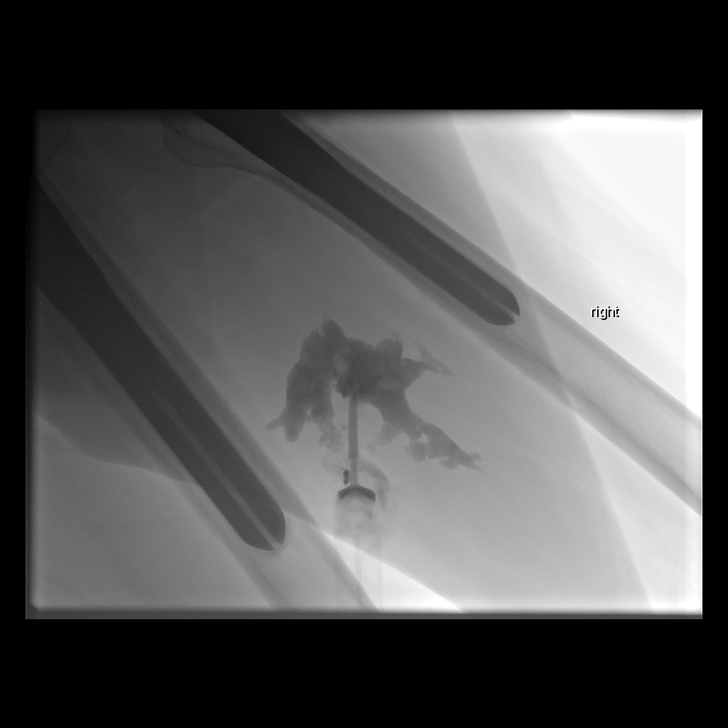
[im 2/10]
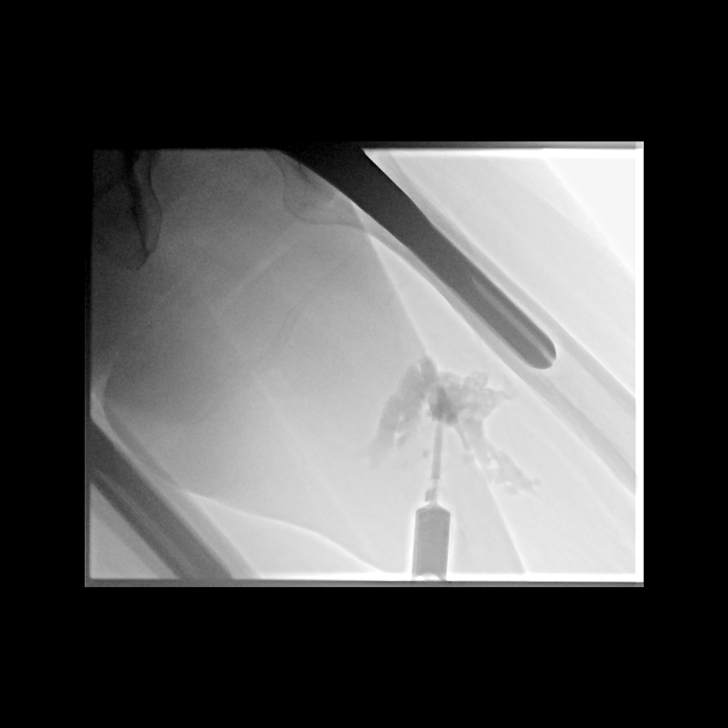
[im 3/10]
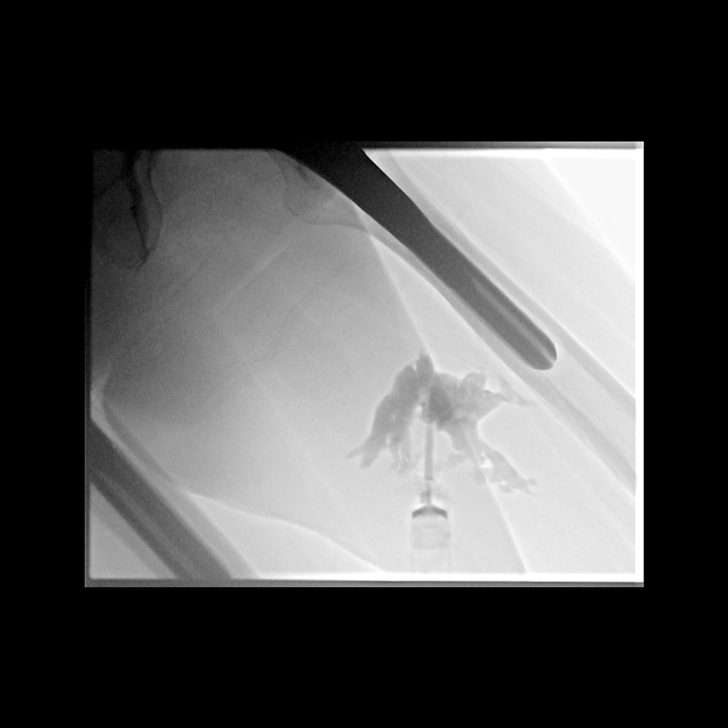
[im 4/10]
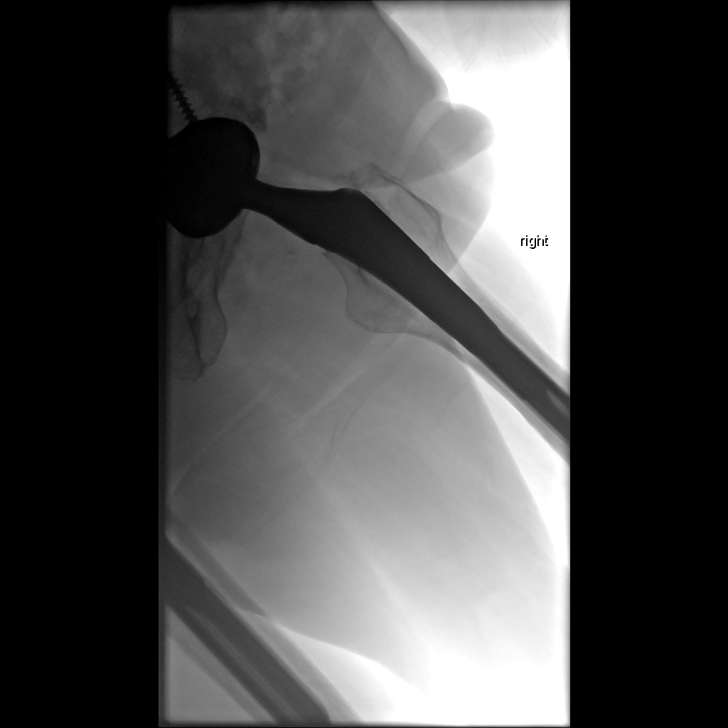
[im 5/10]
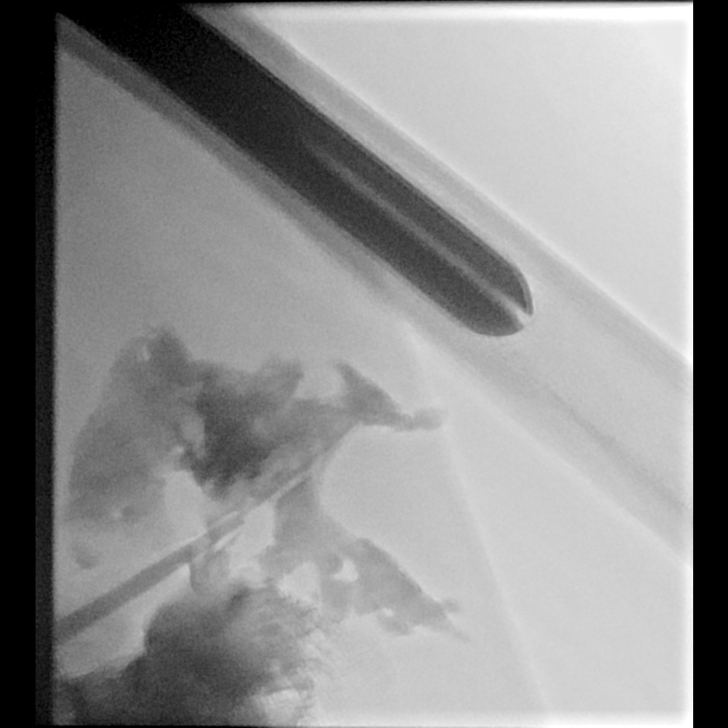
[im 6/10]
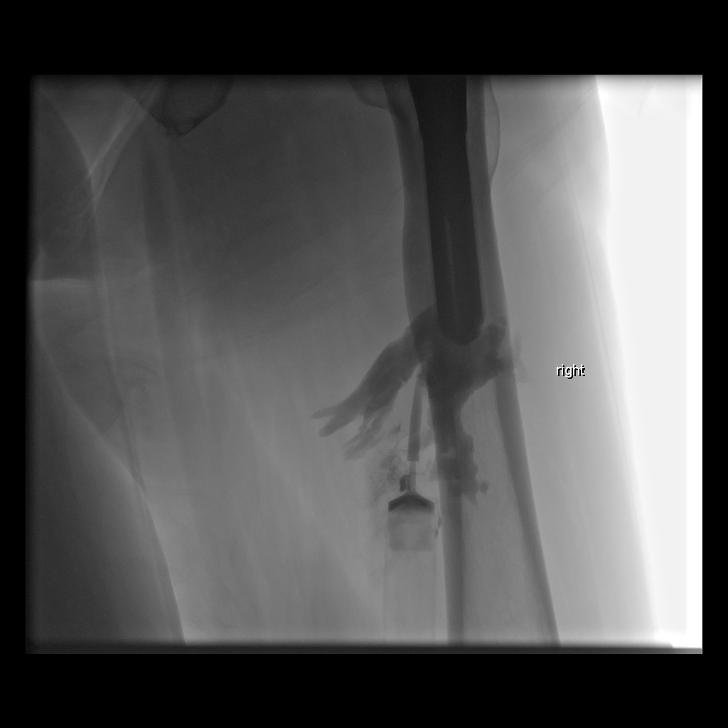
[im 7/10]
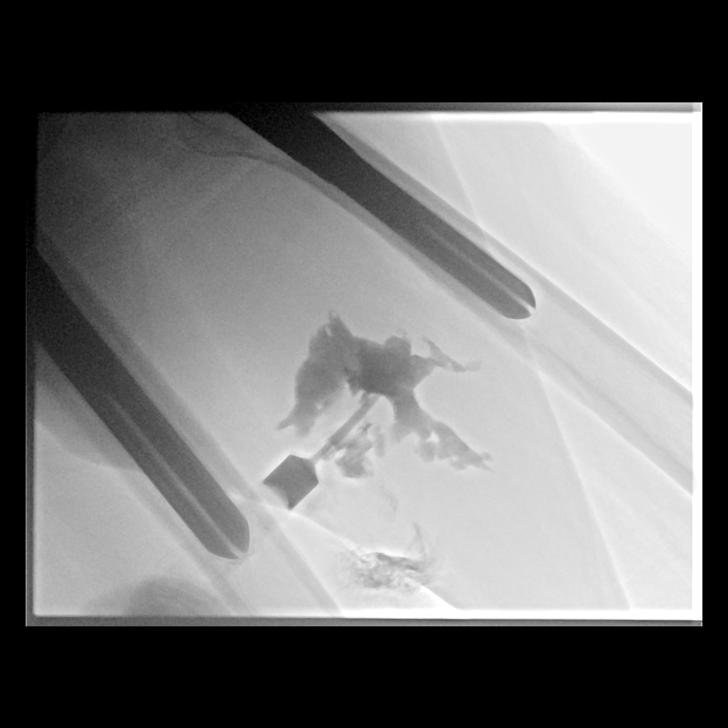
[im 8/10]
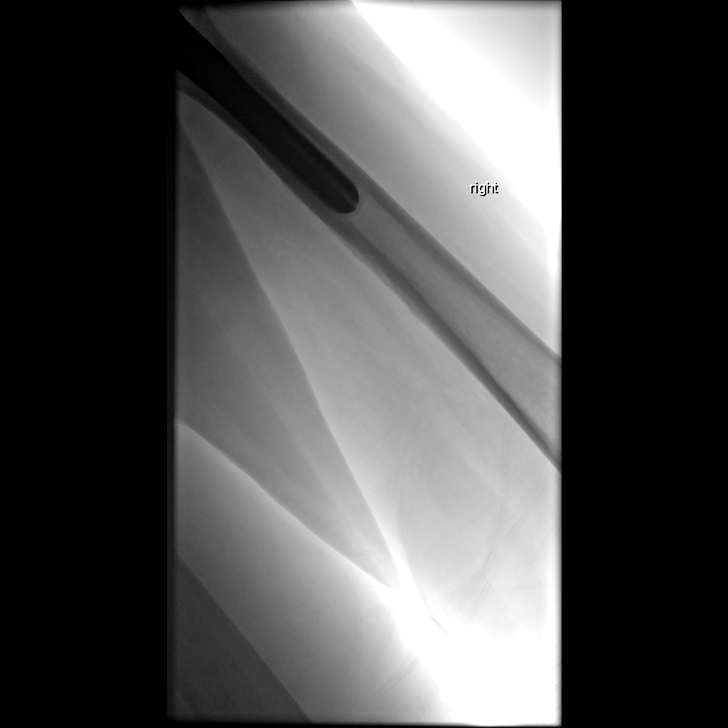
[im 9/10]
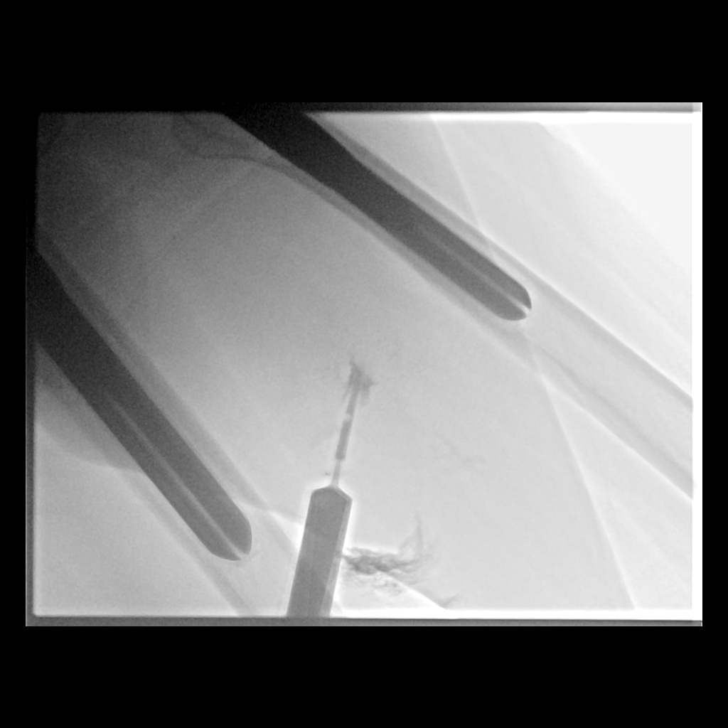
[im 10/10]
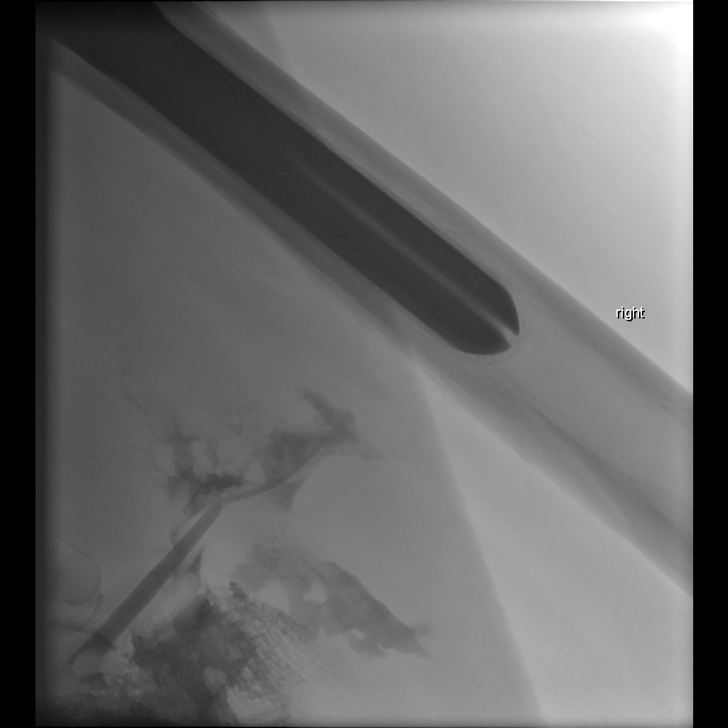

[10 of 10 positions shown; findings below may reference images not displayed]

MEDICATIONS:
The patient is currently admitted to the hospital and receiving
intravenous antibiotics. The antibiotics were administered within an
appropriate time frame prior to the initiation of the procedure.

ANESTHESIA/SEDATION:
None

COMPLICATIONS:
None
PROCEDURE:
Scout images of the right hip and left decubitus position were
acquired at the site of the draining tract.

Using occlusive [REDACTED] tree adapter, the skin wound was
cannulated. Injection of contrast into the soft tissue tract was
performed under fluoroscopy with multiple images stored.

Several obliquities were performed.

A 4 French dilator was then navigated through the occlusive
[REDACTED] tree adapter into the soft tissue tract with additional
probing of the cavity within attempt to rule out communication to
the underlying periosteum/bone.

After multiple images performed, the patient was cleaned and a dry
dressing was placed.

Patient tolerated the procedure well and remained hemodynamically
stable throughout.

No complications were encountered and no significant blood loss.
FINDINGS: Injection of contrast through the [REDACTED] tree adapter at the
draining wound site demonstrates a small soft tissue cavity in the
posterolateral right thigh. No contrast was demonstrated to
communicate to the underlying bone/periosteum.

Additional probing with a 4 French plastic dilator was unable to
prove a communication with the underlying bone.
IMPRESSION: Status post fistulagram at the posterior right thigh draining wound
demonstrates no evidence of communication with the soft tissue
cavity to the underlying bone/periosteum. Small soft tissue cavity
is demonstrated.

## 2017-12-02 DIAGNOSIS — I10 Essential (primary) hypertension: Secondary | ICD-10-CM | POA: Diagnosis not present

## 2017-12-02 DIAGNOSIS — Z0000 Encounter for general adult medical examination without abnormal findings: Secondary | ICD-10-CM | POA: Diagnosis not present

## 2017-12-02 DIAGNOSIS — Z1211 Encounter for screening for malignant neoplasm of colon: Secondary | ICD-10-CM | POA: Diagnosis not present

## 2017-12-02 DIAGNOSIS — E782 Mixed hyperlipidemia: Secondary | ICD-10-CM | POA: Diagnosis not present

## 2017-12-02 DIAGNOSIS — Z136 Encounter for screening for cardiovascular disorders: Secondary | ICD-10-CM | POA: Diagnosis not present

## 2017-12-02 DIAGNOSIS — Z131 Encounter for screening for diabetes mellitus: Secondary | ICD-10-CM | POA: Diagnosis not present

## 2017-12-02 DIAGNOSIS — Z1389 Encounter for screening for other disorder: Secondary | ICD-10-CM | POA: Diagnosis not present

## 2017-12-02 DIAGNOSIS — Z1231 Encounter for screening mammogram for malignant neoplasm of breast: Secondary | ICD-10-CM | POA: Diagnosis not present

## 2017-12-15 DIAGNOSIS — M542 Cervicalgia: Secondary | ICD-10-CM | POA: Diagnosis not present

## 2017-12-15 DIAGNOSIS — S71101S Unspecified open wound, right thigh, sequela: Secondary | ICD-10-CM | POA: Diagnosis not present

## 2017-12-15 DIAGNOSIS — S71101D Unspecified open wound, right thigh, subsequent encounter: Secondary | ICD-10-CM | POA: Diagnosis not present

## 2017-12-21 IMAGING — US US ASPIRATION
1 series · 12 of 16 positions shown · non-contrast
Comparison: none

INDICATION: 70-year-old female with a history of chronic draining wound of right
thigh

[Series 1: us aspiration · 0.07mm/px · 12 of 19 slices shown]
[im 1/19]
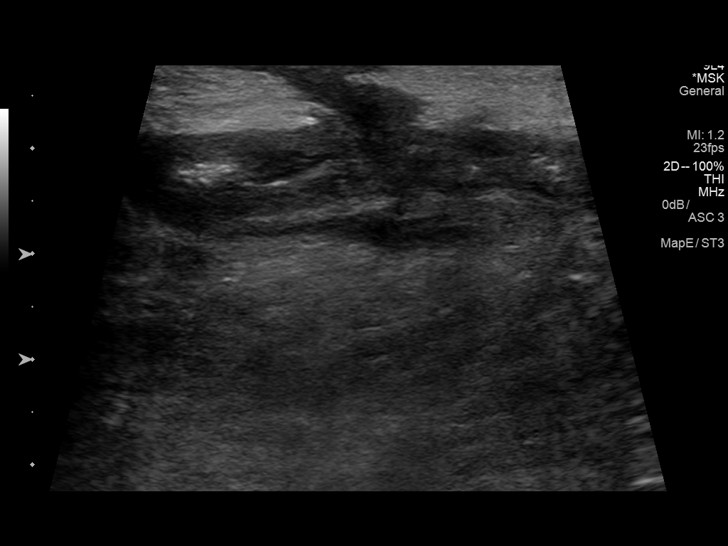
[im 3/19]
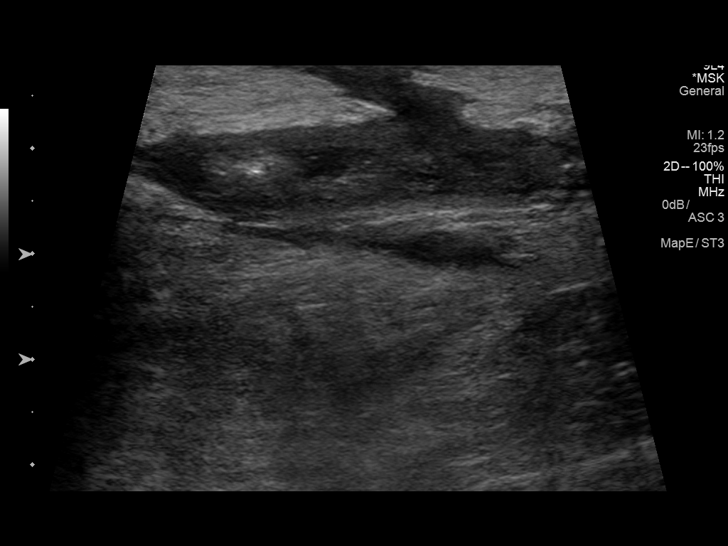
[im 4/19]
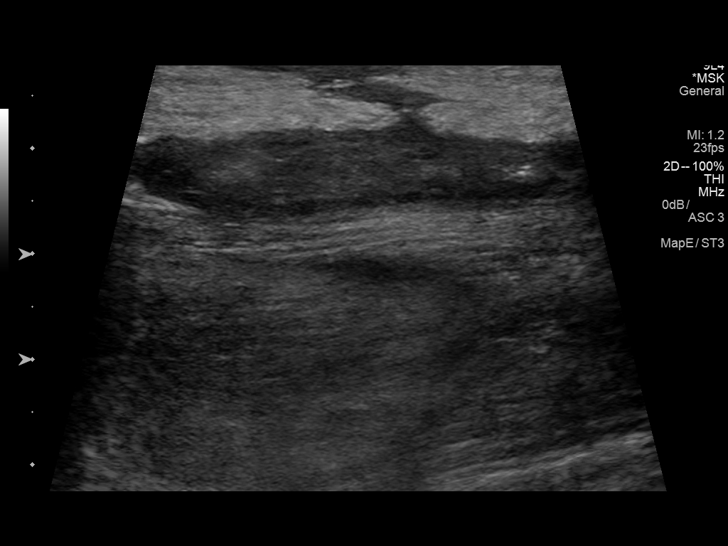
[im 5/19]
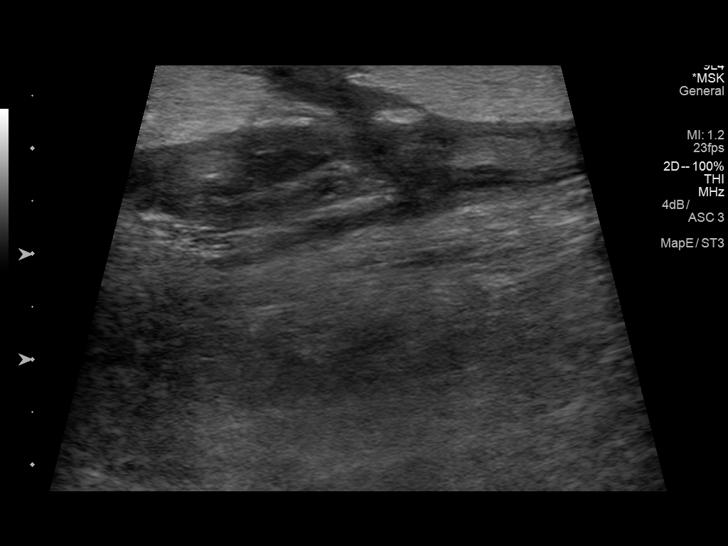
[im 8/19]
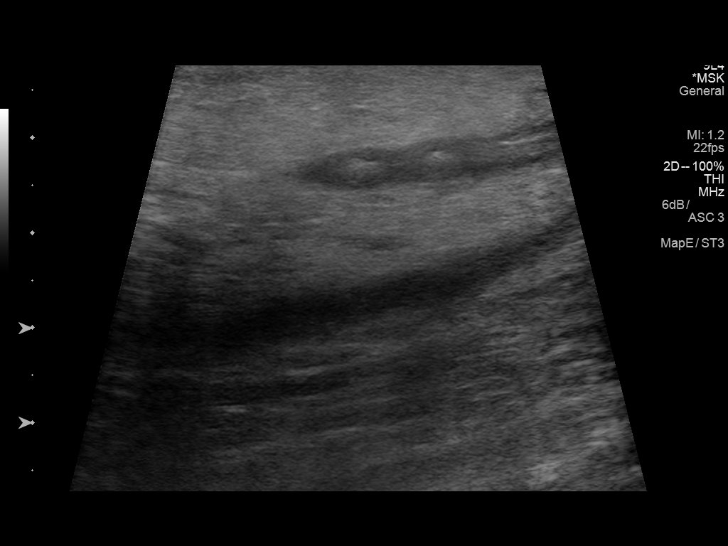
[im 9/19]
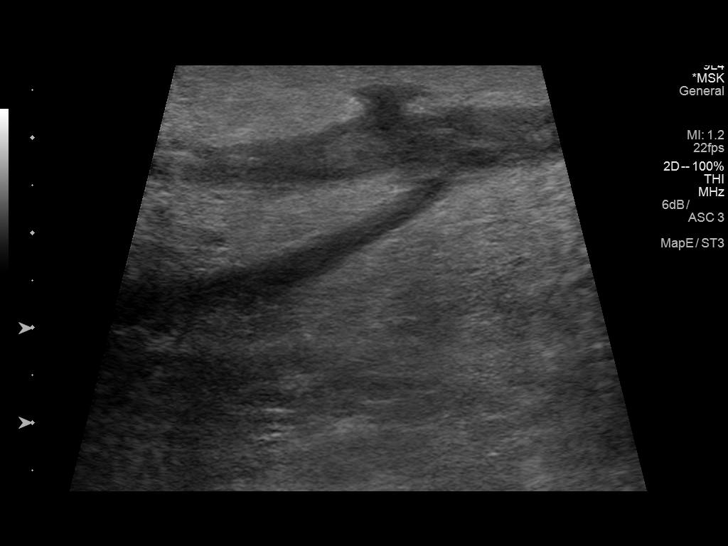
[im 10/19]
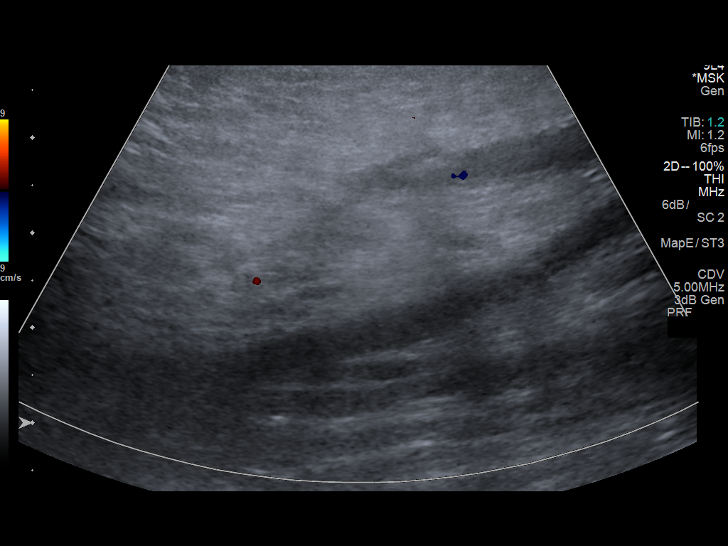
[im 13/19]
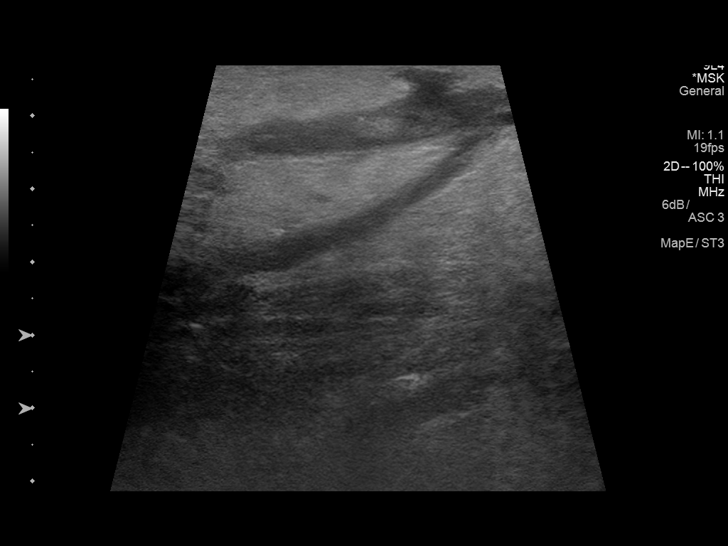
[im 14/19]
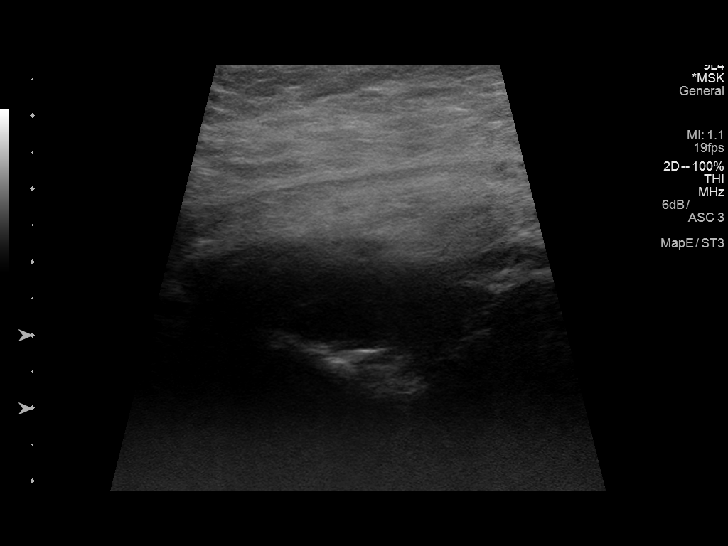
[im 15/19]
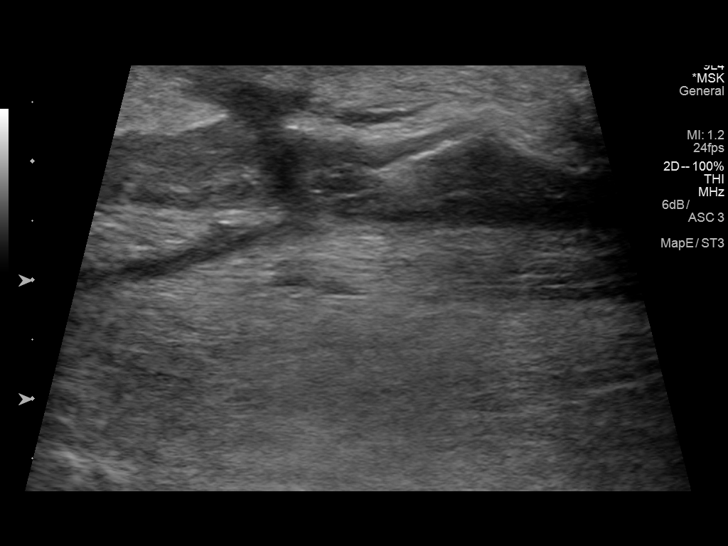
[im 17/19]
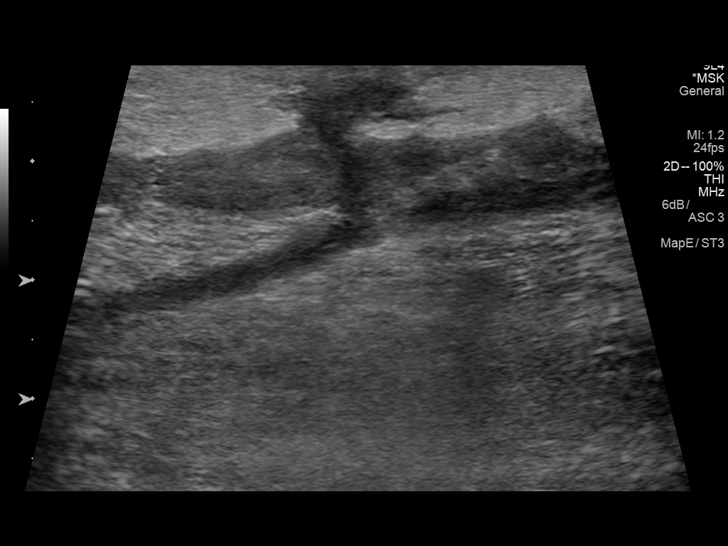
[im 19/19]
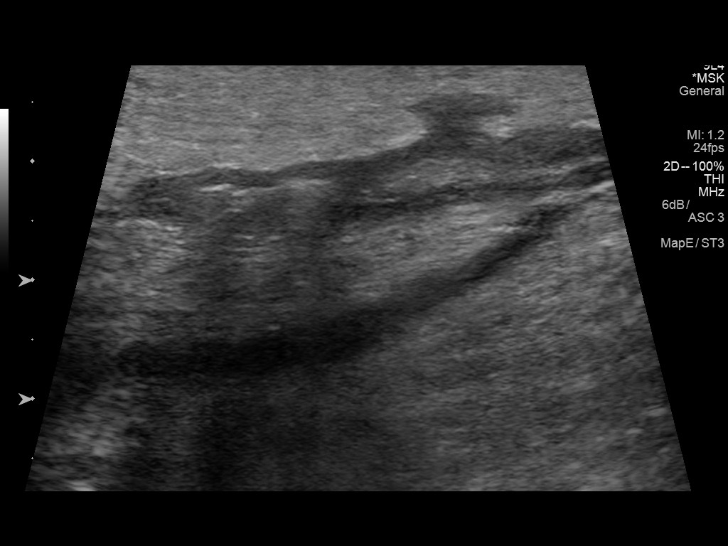

[12 of 16 positions shown; findings below may reference images not displayed]

EXAM:
ULTRASOUND-GUIDED ASPIRATION OF FLUID COLLECTION RIGHT LEG

MEDICATIONS:
The patient is currently admitted to the hospital and receiving
intravenous antibiotics. The antibiotics were administered within an
appropriate time frame prior to the initiation of the procedure.

ANESTHESIA/SEDATION:
Fentanyl 1.0 mcg IV; Versed 50 mg IV

Moderate Sedation Time:  10 minutes

The patient was continuously monitored during the procedure by the
interventional radiology nurse under my direct supervision.

COMPLICATIONS:
None

PROCEDURE:
Informed written consent was obtained from the patient after a
thorough discussion of the procedural risks, benefits and
alternatives. All questions were addressed. Maximal Sterile Barrier
Technique was utilized including caps, mask, sterile gowns, sterile
gloves, sterile drape, hand hygiene and skin antiseptic. A timeout
was performed prior to the initiation of the procedure.

Patient positioned left decubitus on the ultrasound table.
Ultrasound survey of the posterior right thigh was performed with
images stored and sent to PACs.

The patient was then prepped and draped in the usual sterile
fashion, the skin and subcutaneous tissues were generously
infiltrated 1% lidocaine for local anesthesia.

Using ultrasound guidance, a Yueh needle was advanced into the
superficial lentiform collection of the posterior thigh. Once the
needle tip was in position, the catheter was advanced and the needle
was removed. Aspiration of approximately 3-4 cc of thin
serosanguineous fluid was accomplished. Culture was sent for
analysis.

Sterile bandage was placed.

Patient tolerated the procedure well and remained hemodynamically
stable throughout.

No complications were encountered and no significant blood loss.
FINDINGS: Initial ultrasound images of the posterior right thigh demonstrate a
small heterogeneously echoic lentiform fluid collection in the
subcutaneous tissues of the posterior right thigh, with an apparent
tract to the skin surface. Upon evaluation of skin surface there was
serosanguineous fluid draining from a small wound at the skin.

There was a demonstrable tract continuous through the deep tissues
of the thigh to the posterior cortex of the femur caudal to the site
of the wound. There appears to be fluid adjacent to the cortex of
the posterior femur.
IMPRESSION: Status post ultrasound-guided aspiration of a sample of fluid from
complex collection of the posterior right thigh. Sample was sent for
analysis.

Ultrasound survey of the posterior right thigh demonstrates small
fluid collection within the subcutaneous tissues, with a tract to
the skin. There is also a tract through the deeper soft tissues of
the posterior thigh, and appears continuous with a fluid collection
adjacent to the posterior cortex of the upper, posterior femur. A
chronic draining osteomyelitis cannot be excluded. Given the
adjacent hardware and likely reduced specificity/sensitivity of MRI,
nuclear medicine imaging with tagged white blood cell study may be
considered.

## 2017-12-22 DIAGNOSIS — M542 Cervicalgia: Secondary | ICD-10-CM | POA: Diagnosis not present

## 2018-01-19 DIAGNOSIS — M542 Cervicalgia: Secondary | ICD-10-CM | POA: Diagnosis not present

## 2018-01-19 DIAGNOSIS — M79601 Pain in right arm: Secondary | ICD-10-CM | POA: Diagnosis not present

## 2018-02-02 DIAGNOSIS — M542 Cervicalgia: Secondary | ICD-10-CM | POA: Diagnosis not present

## 2018-02-23 DIAGNOSIS — M25511 Pain in right shoulder: Secondary | ICD-10-CM | POA: Diagnosis not present

## 2018-02-27 DIAGNOSIS — M5412 Radiculopathy, cervical region: Secondary | ICD-10-CM | POA: Diagnosis not present

## 2018-03-23 DIAGNOSIS — Z1231 Encounter for screening mammogram for malignant neoplasm of breast: Secondary | ICD-10-CM | POA: Diagnosis not present

## 2018-03-25 DIAGNOSIS — M79641 Pain in right hand: Secondary | ICD-10-CM | POA: Diagnosis not present

## 2018-03-25 DIAGNOSIS — S71001D Unspecified open wound, right hip, subsequent encounter: Secondary | ICD-10-CM | POA: Diagnosis not present

## 2018-03-25 DIAGNOSIS — M542 Cervicalgia: Secondary | ICD-10-CM | POA: Diagnosis not present

## 2018-03-27 DIAGNOSIS — G8321 Monoplegia of upper limb affecting right dominant side: Secondary | ICD-10-CM | POA: Diagnosis not present

## 2018-03-31 DIAGNOSIS — G8321 Monoplegia of upper limb affecting right dominant side: Secondary | ICD-10-CM | POA: Diagnosis not present

## 2018-04-03 DIAGNOSIS — G8321 Monoplegia of upper limb affecting right dominant side: Secondary | ICD-10-CM | POA: Diagnosis not present

## 2018-04-08 DIAGNOSIS — G8321 Monoplegia of upper limb affecting right dominant side: Secondary | ICD-10-CM | POA: Diagnosis not present

## 2018-04-09 DIAGNOSIS — H1032 Unspecified acute conjunctivitis, left eye: Secondary | ICD-10-CM | POA: Diagnosis not present

## 2018-04-10 DIAGNOSIS — Z0189 Encounter for other specified special examinations: Secondary | ICD-10-CM | POA: Diagnosis not present

## 2018-04-10 DIAGNOSIS — S71001D Unspecified open wound, right hip, subsequent encounter: Secondary | ICD-10-CM | POA: Diagnosis not present

## 2018-04-13 DIAGNOSIS — G5621 Lesion of ulnar nerve, right upper limb: Secondary | ICD-10-CM | POA: Diagnosis not present

## 2018-04-13 DIAGNOSIS — G5601 Carpal tunnel syndrome, right upper limb: Secondary | ICD-10-CM | POA: Diagnosis not present

## 2018-04-14 DIAGNOSIS — S71001D Unspecified open wound, right hip, subsequent encounter: Secondary | ICD-10-CM | POA: Diagnosis not present

## 2018-04-14 DIAGNOSIS — G8321 Monoplegia of upper limb affecting right dominant side: Secondary | ICD-10-CM | POA: Diagnosis not present

## 2018-04-17 DIAGNOSIS — G8321 Monoplegia of upper limb affecting right dominant side: Secondary | ICD-10-CM | POA: Diagnosis not present

## 2018-04-17 DIAGNOSIS — S71001D Unspecified open wound, right hip, subsequent encounter: Secondary | ICD-10-CM | POA: Diagnosis not present

## 2018-04-21 DIAGNOSIS — G8321 Monoplegia of upper limb affecting right dominant side: Secondary | ICD-10-CM | POA: Diagnosis not present

## 2018-04-23 DIAGNOSIS — S71001D Unspecified open wound, right hip, subsequent encounter: Secondary | ICD-10-CM | POA: Diagnosis not present

## 2018-04-24 DIAGNOSIS — G8321 Monoplegia of upper limb affecting right dominant side: Secondary | ICD-10-CM | POA: Diagnosis not present

## 2018-04-27 DIAGNOSIS — G8321 Monoplegia of upper limb affecting right dominant side: Secondary | ICD-10-CM | POA: Diagnosis not present

## 2018-04-27 DIAGNOSIS — S71001D Unspecified open wound, right hip, subsequent encounter: Secondary | ICD-10-CM | POA: Diagnosis not present

## 2018-04-30 DIAGNOSIS — H6982 Other specified disorders of Eustachian tube, left ear: Secondary | ICD-10-CM | POA: Diagnosis not present

## 2018-05-04 DIAGNOSIS — S71001D Unspecified open wound, right hip, subsequent encounter: Secondary | ICD-10-CM | POA: Diagnosis not present

## 2018-05-11 DIAGNOSIS — I10 Essential (primary) hypertension: Secondary | ICD-10-CM | POA: Diagnosis not present

## 2018-05-11 DIAGNOSIS — R7303 Prediabetes: Secondary | ICD-10-CM | POA: Diagnosis not present

## 2018-05-11 DIAGNOSIS — E782 Mixed hyperlipidemia: Secondary | ICD-10-CM | POA: Diagnosis not present

## 2018-05-11 DIAGNOSIS — M5136 Other intervertebral disc degeneration, lumbar region: Secondary | ICD-10-CM | POA: Diagnosis not present

## 2018-05-18 DIAGNOSIS — S71001D Unspecified open wound, right hip, subsequent encounter: Secondary | ICD-10-CM | POA: Diagnosis not present

## 2018-06-01 DIAGNOSIS — M25521 Pain in right elbow: Secondary | ICD-10-CM | POA: Diagnosis not present

## 2018-06-01 DIAGNOSIS — S71001D Unspecified open wound, right hip, subsequent encounter: Secondary | ICD-10-CM | POA: Diagnosis not present

## 2018-06-11 DIAGNOSIS — G5601 Carpal tunnel syndrome, right upper limb: Secondary | ICD-10-CM | POA: Diagnosis not present

## 2018-06-11 DIAGNOSIS — G5621 Lesion of ulnar nerve, right upper limb: Secondary | ICD-10-CM | POA: Diagnosis not present

## 2018-07-07 DIAGNOSIS — S4401XA Injury of ulnar nerve at upper arm level, right arm, initial encounter: Secondary | ICD-10-CM | POA: Diagnosis not present

## 2018-07-07 DIAGNOSIS — G5601 Carpal tunnel syndrome, right upper limb: Secondary | ICD-10-CM | POA: Diagnosis not present

## 2018-07-07 DIAGNOSIS — G8918 Other acute postprocedural pain: Secondary | ICD-10-CM | POA: Diagnosis not present

## 2018-07-07 DIAGNOSIS — G5621 Lesion of ulnar nerve, right upper limb: Secondary | ICD-10-CM | POA: Diagnosis not present

## 2018-07-13 DIAGNOSIS — G5601 Carpal tunnel syndrome, right upper limb: Secondary | ICD-10-CM | POA: Diagnosis not present

## 2018-07-13 DIAGNOSIS — Z4789 Encounter for other orthopedic aftercare: Secondary | ICD-10-CM | POA: Diagnosis not present

## 2018-07-13 DIAGNOSIS — G5621 Lesion of ulnar nerve, right upper limb: Secondary | ICD-10-CM | POA: Diagnosis not present

## 2018-07-24 DIAGNOSIS — G5621 Lesion of ulnar nerve, right upper limb: Secondary | ICD-10-CM | POA: Diagnosis not present

## 2018-07-28 DIAGNOSIS — G5621 Lesion of ulnar nerve, right upper limb: Secondary | ICD-10-CM | POA: Diagnosis not present

## 2018-07-31 DIAGNOSIS — G5621 Lesion of ulnar nerve, right upper limb: Secondary | ICD-10-CM | POA: Diagnosis not present

## 2018-08-04 DIAGNOSIS — G5621 Lesion of ulnar nerve, right upper limb: Secondary | ICD-10-CM | POA: Diagnosis not present

## 2018-08-07 DIAGNOSIS — G5621 Lesion of ulnar nerve, right upper limb: Secondary | ICD-10-CM | POA: Diagnosis not present

## 2018-08-10 DIAGNOSIS — Z23 Encounter for immunization: Secondary | ICD-10-CM | POA: Diagnosis not present

## 2018-08-10 DIAGNOSIS — G5621 Lesion of ulnar nerve, right upper limb: Secondary | ICD-10-CM | POA: Diagnosis not present

## 2018-08-14 DIAGNOSIS — G5621 Lesion of ulnar nerve, right upper limb: Secondary | ICD-10-CM | POA: Diagnosis not present

## 2018-08-19 DIAGNOSIS — G5621 Lesion of ulnar nerve, right upper limb: Secondary | ICD-10-CM | POA: Diagnosis not present

## 2018-08-28 DIAGNOSIS — G5621 Lesion of ulnar nerve, right upper limb: Secondary | ICD-10-CM | POA: Diagnosis not present

## 2018-09-04 DIAGNOSIS — G5621 Lesion of ulnar nerve, right upper limb: Secondary | ICD-10-CM | POA: Diagnosis not present

## 2018-09-08 DIAGNOSIS — T8130XD Disruption of wound, unspecified, subsequent encounter: Secondary | ICD-10-CM | POA: Diagnosis not present

## 2018-09-11 DIAGNOSIS — G5621 Lesion of ulnar nerve, right upper limb: Secondary | ICD-10-CM | POA: Diagnosis not present

## 2018-09-15 DIAGNOSIS — S71101S Unspecified open wound, right thigh, sequela: Secondary | ICD-10-CM | POA: Diagnosis not present

## 2018-09-18 DIAGNOSIS — G5621 Lesion of ulnar nerve, right upper limb: Secondary | ICD-10-CM | POA: Diagnosis not present

## 2018-09-21 DIAGNOSIS — S71001D Unspecified open wound, right hip, subsequent encounter: Secondary | ICD-10-CM | POA: Diagnosis not present

## 2018-09-25 DIAGNOSIS — G5621 Lesion of ulnar nerve, right upper limb: Secondary | ICD-10-CM | POA: Diagnosis not present

## 2018-10-06 DIAGNOSIS — T148XXD Other injury of unspecified body region, subsequent encounter: Secondary | ICD-10-CM | POA: Diagnosis not present

## 2018-10-20 DIAGNOSIS — S71101S Unspecified open wound, right thigh, sequela: Secondary | ICD-10-CM | POA: Diagnosis not present

## 2018-11-09 DIAGNOSIS — S71001D Unspecified open wound, right hip, subsequent encounter: Secondary | ICD-10-CM | POA: Diagnosis not present

## 2018-12-03 DIAGNOSIS — Z0000 Encounter for general adult medical examination without abnormal findings: Secondary | ICD-10-CM | POA: Diagnosis not present

## 2018-12-03 DIAGNOSIS — R7303 Prediabetes: Secondary | ICD-10-CM | POA: Diagnosis not present

## 2018-12-03 DIAGNOSIS — I10 Essential (primary) hypertension: Secondary | ICD-10-CM | POA: Diagnosis not present

## 2018-12-03 DIAGNOSIS — Z1211 Encounter for screening for malignant neoplasm of colon: Secondary | ICD-10-CM | POA: Diagnosis not present

## 2018-12-03 DIAGNOSIS — E782 Mixed hyperlipidemia: Secondary | ICD-10-CM | POA: Diagnosis not present

## 2019-01-15 DIAGNOSIS — S71001D Unspecified open wound, right hip, subsequent encounter: Secondary | ICD-10-CM | POA: Diagnosis not present

## 2019-04-27 DIAGNOSIS — R7303 Prediabetes: Secondary | ICD-10-CM | POA: Diagnosis not present

## 2019-04-27 DIAGNOSIS — E786 Lipoprotein deficiency: Secondary | ICD-10-CM | POA: Diagnosis not present

## 2019-04-27 DIAGNOSIS — Z1211 Encounter for screening for malignant neoplasm of colon: Secondary | ICD-10-CM | POA: Diagnosis not present

## 2019-04-27 DIAGNOSIS — I10 Essential (primary) hypertension: Secondary | ICD-10-CM | POA: Diagnosis not present

## 2019-04-27 DIAGNOSIS — E782 Mixed hyperlipidemia: Secondary | ICD-10-CM | POA: Diagnosis not present

## 2019-05-21 DIAGNOSIS — Z1231 Encounter for screening mammogram for malignant neoplasm of breast: Secondary | ICD-10-CM | POA: Diagnosis not present

## 2019-05-25 DIAGNOSIS — Z1211 Encounter for screening for malignant neoplasm of colon: Secondary | ICD-10-CM | POA: Diagnosis not present

## 2019-06-29 DIAGNOSIS — Z20828 Contact with and (suspected) exposure to other viral communicable diseases: Secondary | ICD-10-CM | POA: Diagnosis not present

## 2019-08-10 DIAGNOSIS — T8130XD Disruption of wound, unspecified, subsequent encounter: Secondary | ICD-10-CM | POA: Diagnosis not present

## 2019-08-30 DIAGNOSIS — Z23 Encounter for immunization: Secondary | ICD-10-CM | POA: Diagnosis not present

## 2019-12-06 DIAGNOSIS — I10 Essential (primary) hypertension: Secondary | ICD-10-CM | POA: Diagnosis not present

## 2019-12-06 DIAGNOSIS — Z1211 Encounter for screening for malignant neoplasm of colon: Secondary | ICD-10-CM | POA: Diagnosis not present

## 2019-12-06 DIAGNOSIS — Z1231 Encounter for screening mammogram for malignant neoplasm of breast: Secondary | ICD-10-CM | POA: Diagnosis not present

## 2019-12-06 DIAGNOSIS — Z0000 Encounter for general adult medical examination without abnormal findings: Secondary | ICD-10-CM | POA: Diagnosis not present

## 2019-12-06 DIAGNOSIS — E782 Mixed hyperlipidemia: Secondary | ICD-10-CM | POA: Diagnosis not present

## 2019-12-06 DIAGNOSIS — R7303 Prediabetes: Secondary | ICD-10-CM | POA: Diagnosis not present

## 2019-12-06 DIAGNOSIS — E786 Lipoprotein deficiency: Secondary | ICD-10-CM | POA: Diagnosis not present

## 2019-12-06 DIAGNOSIS — E2839 Other primary ovarian failure: Secondary | ICD-10-CM | POA: Diagnosis not present

## 2019-12-07 DIAGNOSIS — Z888 Allergy status to other drugs, medicaments and biological substances status: Secondary | ICD-10-CM | POA: Diagnosis not present

## 2019-12-07 DIAGNOSIS — Z809 Family history of malignant neoplasm, unspecified: Secondary | ICD-10-CM | POA: Diagnosis not present

## 2019-12-07 DIAGNOSIS — Z96649 Presence of unspecified artificial hip joint: Secondary | ICD-10-CM | POA: Diagnosis not present

## 2019-12-07 DIAGNOSIS — I10 Essential (primary) hypertension: Secondary | ICD-10-CM | POA: Diagnosis not present

## 2019-12-20 DIAGNOSIS — E782 Mixed hyperlipidemia: Secondary | ICD-10-CM | POA: Diagnosis not present

## 2019-12-20 DIAGNOSIS — R7303 Prediabetes: Secondary | ICD-10-CM | POA: Diagnosis not present

## 2019-12-20 DIAGNOSIS — I10 Essential (primary) hypertension: Secondary | ICD-10-CM | POA: Diagnosis not present

## 2019-12-28 ENCOUNTER — Emergency Department (HOSPITAL_COMMUNITY): Payer: Medicare HMO

## 2019-12-28 ENCOUNTER — Other Ambulatory Visit: Payer: Self-pay

## 2019-12-28 ENCOUNTER — Emergency Department (HOSPITAL_COMMUNITY): Payer: Medicare HMO | Admitting: Anesthesiology

## 2019-12-28 ENCOUNTER — Encounter (HOSPITAL_COMMUNITY): Payer: Self-pay | Admitting: Emergency Medicine

## 2019-12-28 ENCOUNTER — Encounter (HOSPITAL_COMMUNITY)
Admission: EM | Disposition: A | Discharge status: Skilled Nursing Facility | Payer: Self-pay | Source: Home / Self Care | Attending: Internal Medicine

## 2019-12-28 ENCOUNTER — Inpatient Hospital Stay (HOSPITAL_COMMUNITY)
Admission: EM | Admit: 2019-12-28 | Discharge: 2019-12-31 | DRG: 481 | Disposition: A | Discharge status: Skilled Nursing Facility | Payer: Medicare HMO | Attending: Internal Medicine | Admitting: Internal Medicine

## 2019-12-28 ENCOUNTER — Inpatient Hospital Stay (HOSPITAL_COMMUNITY): Payer: Medicare HMO

## 2019-12-28 DIAGNOSIS — D72829 Elevated white blood cell count, unspecified: Secondary | ICD-10-CM | POA: Diagnosis not present

## 2019-12-28 DIAGNOSIS — S72471A Torus fracture of lower end of right femur, initial encounter for closed fracture: Secondary | ICD-10-CM | POA: Diagnosis not present

## 2019-12-28 DIAGNOSIS — K5900 Constipation, unspecified: Secondary | ICD-10-CM | POA: Diagnosis not present

## 2019-12-28 DIAGNOSIS — Z8616 Personal history of COVID-19: Secondary | ICD-10-CM

## 2019-12-28 DIAGNOSIS — M2550 Pain in unspecified joint: Secondary | ICD-10-CM | POA: Diagnosis not present

## 2019-12-28 DIAGNOSIS — M1712 Unilateral primary osteoarthritis, left knee: Secondary | ICD-10-CM | POA: Diagnosis not present

## 2019-12-28 DIAGNOSIS — W000XXA Fall on same level due to ice and snow, initial encounter: Secondary | ICD-10-CM | POA: Diagnosis present

## 2019-12-28 DIAGNOSIS — D62 Acute posthemorrhagic anemia: Secondary | ICD-10-CM | POA: Diagnosis not present

## 2019-12-28 DIAGNOSIS — Z96643 Presence of artificial hip joint, bilateral: Secondary | ICD-10-CM | POA: Diagnosis not present

## 2019-12-28 DIAGNOSIS — Z4789 Encounter for other orthopedic aftercare: Secondary | ICD-10-CM | POA: Diagnosis not present

## 2019-12-28 DIAGNOSIS — S72401A Unspecified fracture of lower end of right femur, initial encounter for closed fracture: Secondary | ICD-10-CM | POA: Diagnosis not present

## 2019-12-28 DIAGNOSIS — R52 Pain, unspecified: Secondary | ICD-10-CM

## 2019-12-28 DIAGNOSIS — W19XXXA Unspecified fall, initial encounter: Secondary | ICD-10-CM | POA: Diagnosis not present

## 2019-12-28 DIAGNOSIS — Z7401 Bed confinement status: Secondary | ICD-10-CM | POA: Diagnosis not present

## 2019-12-28 DIAGNOSIS — S72391A Other fracture of shaft of right femur, initial encounter for closed fracture: Secondary | ICD-10-CM | POA: Diagnosis not present

## 2019-12-28 DIAGNOSIS — Z8614 Personal history of Methicillin resistant Staphylococcus aureus infection: Secondary | ICD-10-CM

## 2019-12-28 DIAGNOSIS — S72451A Displaced supracondylar fracture without intracondylar extension of lower end of right femur, initial encounter for closed fracture: Secondary | ICD-10-CM | POA: Diagnosis not present

## 2019-12-28 DIAGNOSIS — M6281 Muscle weakness (generalized): Secondary | ICD-10-CM | POA: Diagnosis not present

## 2019-12-28 DIAGNOSIS — U071 COVID-19: Secondary | ICD-10-CM | POA: Diagnosis not present

## 2019-12-28 DIAGNOSIS — R509 Fever, unspecified: Secondary | ICD-10-CM | POA: Diagnosis not present

## 2019-12-28 DIAGNOSIS — I10 Essential (primary) hypertension: Secondary | ICD-10-CM | POA: Diagnosis not present

## 2019-12-28 DIAGNOSIS — Y92008 Other place in unspecified non-institutional (private) residence as the place of occurrence of the external cause: Secondary | ICD-10-CM

## 2019-12-28 DIAGNOSIS — S7291XA Unspecified fracture of right femur, initial encounter for closed fracture: Secondary | ICD-10-CM | POA: Diagnosis present

## 2019-12-28 DIAGNOSIS — M1990 Unspecified osteoarthritis, unspecified site: Secondary | ICD-10-CM | POA: Diagnosis not present

## 2019-12-28 DIAGNOSIS — S7291XD Unspecified fracture of right femur, subsequent encounter for closed fracture with routine healing: Secondary | ICD-10-CM | POA: Diagnosis not present

## 2019-12-28 DIAGNOSIS — S79929A Unspecified injury of unspecified thigh, initial encounter: Secondary | ICD-10-CM | POA: Diagnosis not present

## 2019-12-28 DIAGNOSIS — Z01818 Encounter for other preprocedural examination: Secondary | ICD-10-CM | POA: Diagnosis not present

## 2019-12-28 DIAGNOSIS — R262 Difficulty in walking, not elsewhere classified: Secondary | ICD-10-CM | POA: Diagnosis not present

## 2019-12-28 DIAGNOSIS — Z419 Encounter for procedure for purposes other than remedying health state, unspecified: Secondary | ICD-10-CM

## 2019-12-28 DIAGNOSIS — E876 Hypokalemia: Secondary | ICD-10-CM | POA: Diagnosis not present

## 2019-12-28 DIAGNOSIS — S72331A Displaced oblique fracture of shaft of right femur, initial encounter for closed fracture: Secondary | ICD-10-CM | POA: Diagnosis not present

## 2019-12-28 HISTORY — PX: ORIF FEMUR FRACTURE: SHX2119

## 2019-12-28 LAB — CBC WITH DIFFERENTIAL/PLATELET
Abs Immature Granulocytes: 0.06 10*3/uL (ref 0.00–0.07)
Basophils Absolute: 0 10*3/uL (ref 0.0–0.1)
Basophils Relative: 0 %
Eosinophils Absolute: 0.2 10*3/uL (ref 0.0–0.5)
Eosinophils Relative: 1 %
HCT: 42.5 % (ref 36.0–46.0)
Hemoglobin: 14.4 g/dL (ref 12.0–15.0)
Immature Granulocytes: 1 %
Lymphocytes Relative: 7 %
Lymphs Abs: 0.9 10*3/uL (ref 0.7–4.0)
MCH: 28.7 pg (ref 26.0–34.0)
MCHC: 33.9 g/dL (ref 30.0–36.0)
MCV: 84.8 fL (ref 80.0–100.0)
Monocytes Absolute: 0.5 10*3/uL (ref 0.1–1.0)
Monocytes Relative: 4 %
Neutro Abs: 10.6 10*3/uL — ABNORMAL HIGH (ref 1.7–7.7)
Neutrophils Relative %: 87 %
Platelets: 224 10*3/uL (ref 150–400)
RBC: 5.01 MIL/uL (ref 3.87–5.11)
RDW: 13.1 % (ref 11.5–15.5)
WBC: 12.2 10*3/uL — ABNORMAL HIGH (ref 4.0–10.5)
nRBC: 0 % (ref 0.0–0.2)

## 2019-12-28 LAB — COMPREHENSIVE METABOLIC PANEL
ALT: 18 U/L (ref 0–44)
AST: 27 U/L (ref 15–41)
Albumin: 4.1 g/dL (ref 3.5–5.0)
Alkaline Phosphatase: 80 U/L (ref 38–126)
Anion gap: 12 (ref 5–15)
BUN: 17 mg/dL (ref 8–23)
CO2: 26 mmol/L (ref 22–32)
Calcium: 9.2 mg/dL (ref 8.9–10.3)
Chloride: 103 mmol/L (ref 98–111)
Creatinine, Ser: 0.67 mg/dL (ref 0.44–1.00)
GFR calc Af Amer: >60 mL/min (ref >60)
GFR calc non Af Amer: >60 mL/min (ref >60)
Glucose, Bld: 140 mg/dL — ABNORMAL HIGH (ref 70–99)
Potassium: 3.5 mmol/L (ref 3.5–5.1)
Sodium: 141 mmol/L (ref 135–145)
Total Bilirubin: 0.6 mg/dL (ref 0.3–1.2)
Total Protein: 7.3 g/dL (ref 6.5–8.1)

## 2019-12-28 LAB — RESPIRATORY PANEL BY RT PCR (FLU A&B, COVID)
Influenza A by PCR: NEGATIVE
Influenza B by PCR: NEGATIVE
SARS Coronavirus 2 by RT PCR: POSITIVE — AB

## 2019-12-28 SURGERY — OPEN REDUCTION INTERNAL FIXATION (ORIF) DISTAL FEMUR FRACTURE
Anesthesia: General | Site: Leg Upper | Laterality: Right

## 2019-12-28 MED ORDER — ONDANSETRON HCL 4 MG/2ML IJ SOLN
4.0000 mg | Freq: Four times a day (QID) | INTRAMUSCULAR | Status: AC | PRN
  Administered 2019-12-28: 21:00:00 4 mg via INTRAVENOUS

## 2019-12-28 MED ORDER — HYDROMORPHONE HCL 1 MG/ML IJ SOLN: 0.5000 mg | INTRAMUSCULAR | Status: DC | PRN

## 2019-12-28 MED ORDER — CEFAZOLIN SODIUM-DEXTROSE 2-4 GM/100ML-% IV SOLN
INTRAVENOUS | Status: AC
  Filled 2019-12-28: qty 100

## 2019-12-28 MED ORDER — SODIUM CHLORIDE 0.9 % IV SOLN: INTRAVENOUS | Status: DC

## 2019-12-28 MED ORDER — HYDROCHLOROTHIAZIDE 25 MG PO TABS
25.0000 mg | ORAL_TABLET | Freq: Every day | ORAL | Status: DC
  Administered 2019-12-29 – 2019-12-31 (×3): 25 mg via ORAL
  Filled 2019-12-28 (×3): qty 1

## 2019-12-28 MED ORDER — METHOCARBAMOL 1000 MG/10ML IJ SOLN
500.0000 mg | Freq: Four times a day (QID) | INTRAVENOUS | Status: DC | PRN
  Filled 2019-12-28: qty 5

## 2019-12-28 MED ORDER — DOCUSATE SODIUM 100 MG PO CAPS
100.0000 mg | ORAL_CAPSULE | Freq: Two times a day (BID) | ORAL | Status: DC
  Administered 2019-12-28 – 2019-12-31 (×6): 100 mg via ORAL
  Filled 2019-12-28 (×6): qty 1

## 2019-12-28 MED ORDER — POLYETHYLENE GLYCOL 3350 17 G PO PACK: 17.0000 g | PACK | Freq: Every day | ORAL | Status: DC | PRN

## 2019-12-28 MED ORDER — HYDRALAZINE HCL 20 MG/ML IJ SOLN: 10.0000 mg | Freq: Four times a day (QID) | INTRAMUSCULAR | Status: DC | PRN

## 2019-12-28 MED ORDER — HYDROCODONE-ACETAMINOPHEN 5-325 MG PO TABS: 1.0000 | ORAL_TABLET | Freq: Four times a day (QID) | ORAL | Status: DC | PRN

## 2019-12-28 MED ORDER — ENOXAPARIN SODIUM 40 MG/0.4ML ~~LOC~~ SOLN
40.0000 mg | SUBCUTANEOUS | Status: DC
  Administered 2019-12-29 – 2019-12-31 (×3): 40 mg via SUBCUTANEOUS
  Filled 2019-12-28 (×3): qty 0.4

## 2019-12-28 MED ORDER — VANCOMYCIN HCL IN DEXTROSE 1-5 GM/200ML-% IV SOLN
INTRAVENOUS | Status: AC
  Filled 2019-12-28: qty 200

## 2019-12-28 MED ORDER — ONDANSETRON HCL 4 MG/2ML IJ SOLN
INTRAMUSCULAR | Status: AC
  Filled 2019-12-28: qty 2

## 2019-12-28 MED ORDER — DIPHENHYDRAMINE HCL 12.5 MG/5ML PO ELIX: 12.5000 mg | ORAL_SOLUTION | ORAL | Status: DC | PRN

## 2019-12-28 MED ORDER — ONDANSETRON HCL 4 MG PO TABS: 4.0000 mg | ORAL_TABLET | Freq: Four times a day (QID) | ORAL | Status: DC | PRN

## 2019-12-28 MED ORDER — METOPROLOL TARTRATE 50 MG PO TABS
100.0000 mg | ORAL_TABLET | Freq: Two times a day (BID) | ORAL | Status: DC
  Administered 2019-12-28 – 2019-12-31 (×6): 100 mg via ORAL
  Filled 2019-12-28 (×6): qty 2

## 2019-12-28 MED ORDER — BISACODYL 10 MG RE SUPP: 10.0000 mg | Freq: Every day | RECTAL | Status: DC | PRN

## 2019-12-28 MED ORDER — SUCCINYLCHOLINE CHLORIDE 200 MG/10ML IV SOSY
PREFILLED_SYRINGE | INTRAVENOUS | Status: DC | PRN
  Administered 2019-12-28: 100 mg via INTRAVENOUS

## 2019-12-28 MED ORDER — METHOCARBAMOL 500 MG PO TABS: 500.0000 mg | ORAL_TABLET | Freq: Four times a day (QID) | ORAL | Status: DC | PRN

## 2019-12-28 MED ORDER — VANCOMYCIN HCL 1000 MG IV SOLR
INTRAVENOUS | Status: DC | PRN
  Administered 2019-12-28: 1000 mg via TOPICAL

## 2019-12-28 MED ORDER — SENNA 8.6 MG PO TABS
1.0000 | ORAL_TABLET | Freq: Two times a day (BID) | ORAL | Status: DC
  Administered 2019-12-28 – 2019-12-31 (×6): 8.6 mg via ORAL
  Filled 2019-12-28 (×6): qty 1

## 2019-12-28 MED ORDER — ASPIRIN EC 81 MG PO TBEC: 81.0000 mg | DELAYED_RELEASE_TABLET | Freq: Every day | ORAL | Status: DC

## 2019-12-28 MED ORDER — SUGAMMADEX SODIUM 200 MG/2ML IV SOLN
INTRAVENOUS | Status: DC | PRN
  Administered 2019-12-28: 200 mg via INTRAVENOUS

## 2019-12-28 MED ORDER — LIDOCAINE 2% (20 MG/ML) 5 ML SYRINGE
INTRAMUSCULAR | Status: AC
  Filled 2019-12-28: qty 5

## 2019-12-28 MED ORDER — PROPOFOL 10 MG/ML IV BOLUS
INTRAVENOUS | Status: AC
  Filled 2019-12-28: qty 20

## 2019-12-28 MED ORDER — ONDANSETRON HCL 4 MG/2ML IJ SOLN
4.0000 mg | Freq: Four times a day (QID) | INTRAMUSCULAR | Status: DC | PRN
  Administered 2019-12-28: 15:00:00 4 mg via INTRAVENOUS
  Filled 2019-12-28: qty 2

## 2019-12-28 MED ORDER — HYDROMORPHONE HCL 1 MG/ML IJ SOLN
1.0000 mg | INTRAMUSCULAR | Status: DC | PRN
  Administered 2019-12-28: 1 mg via INTRAVENOUS
  Filled 2019-12-28: qty 1

## 2019-12-28 MED ORDER — ACETAMINOPHEN 650 MG RE SUPP: 650.0000 mg | Freq: Four times a day (QID) | RECTAL | Status: DC | PRN

## 2019-12-28 MED ORDER — VANCOMYCIN HCL 1000 MG IV SOLR
INTRAVENOUS | Status: DC | PRN
  Administered 2019-12-28: 20:00:00 1000 mg via INTRAVENOUS

## 2019-12-28 MED ORDER — ACETAMINOPHEN 325 MG PO TABS
325.0000 mg | ORAL_TABLET | Freq: Four times a day (QID) | ORAL | Status: DC | PRN
  Administered 2019-12-31: 14:00:00 650 mg via ORAL
  Filled 2019-12-28: qty 2

## 2019-12-28 MED ORDER — FENTANYL CITRATE (PF) 100 MCG/2ML IJ SOLN
INTRAMUSCULAR | Status: DC | PRN
  Administered 2019-12-28 (×2): 50 ug via INTRAVENOUS
  Administered 2019-12-28: 100 ug via INTRAVENOUS
  Administered 2019-12-28: 50 ug via INTRAVENOUS

## 2019-12-28 MED ORDER — LISINOPRIL 20 MG PO TABS
20.0000 mg | ORAL_TABLET | Freq: Every day | ORAL | Status: DC
  Administered 2019-12-29 – 2019-12-31 (×3): 20 mg via ORAL
  Filled 2019-12-28 (×3): qty 1

## 2019-12-28 MED ORDER — FENTANYL CITRATE (PF) 250 MCG/5ML IJ SOLN
INTRAMUSCULAR | Status: AC
  Filled 2019-12-28: qty 5

## 2019-12-28 MED ORDER — ROCURONIUM BROMIDE 10 MG/ML (PF) SYRINGE
PREFILLED_SYRINGE | INTRAVENOUS | Status: DC | PRN
  Administered 2019-12-28: 40 mg via INTRAVENOUS

## 2019-12-28 MED ORDER — CEFAZOLIN SODIUM-DEXTROSE 2-3 GM-%(50ML) IV SOLR
INTRAVENOUS | Status: DC | PRN
  Administered 2019-12-28: 2 g via INTRAVENOUS

## 2019-12-28 MED ORDER — ROCURONIUM BROMIDE 10 MG/ML (PF) SYRINGE
PREFILLED_SYRINGE | INTRAVENOUS | Status: AC
  Filled 2019-12-28: qty 10

## 2019-12-28 MED ORDER — VANCOMYCIN HCL 1000 MG IV SOLR
INTRAVENOUS | Status: AC
  Filled 2019-12-28: qty 1000

## 2019-12-28 MED ORDER — PROPOFOL 10 MG/ML IV BOLUS
INTRAVENOUS | Status: DC | PRN
  Administered 2019-12-28: 120 mg via INTRAVENOUS

## 2019-12-28 MED ORDER — DEXAMETHASONE SODIUM PHOSPHATE 10 MG/ML IJ SOLN
INTRAMUSCULAR | Status: DC | PRN
  Administered 2019-12-28: 10 mg via INTRAVENOUS

## 2019-12-28 MED ORDER — METHOCARBAMOL 500 MG PO TABS
500.0000 mg | ORAL_TABLET | Freq: Four times a day (QID) | ORAL | Status: DC | PRN
  Administered 2019-12-30: 500 mg via ORAL
  Filled 2019-12-28: qty 1

## 2019-12-28 MED ORDER — MIDAZOLAM HCL 2 MG/2ML IJ SOLN
INTRAMUSCULAR | Status: AC
  Filled 2019-12-28: qty 2

## 2019-12-28 MED ORDER — LISINOPRIL-HYDROCHLOROTHIAZIDE 20-25 MG PO TABS: 1.0000 | ORAL_TABLET | Freq: Every morning | ORAL | Status: DC

## 2019-12-28 MED ORDER — OXYCODONE HCL 5 MG PO TABS
5.0000 mg | ORAL_TABLET | ORAL | Status: DC | PRN
  Administered 2019-12-29 (×2): 5 mg via ORAL
  Administered 2019-12-30 – 2019-12-31 (×4): 10 mg via ORAL
  Filled 2019-12-28 (×2): qty 2
  Filled 2019-12-28: qty 1
  Filled 2019-12-28: qty 2
  Filled 2019-12-28 (×2): qty 1
  Filled 2019-12-28: qty 2

## 2019-12-28 MED ORDER — 0.9 % SODIUM CHLORIDE (POUR BTL) OPTIME
TOPICAL | Status: DC | PRN
  Administered 2019-12-28: 1000 mL

## 2019-12-28 MED ORDER — LIDOCAINE 2% (20 MG/ML) 5 ML SYRINGE
INTRAMUSCULAR | Status: DC | PRN
  Administered 2019-12-28: 60 mg via INTRAVENOUS

## 2019-12-28 MED ORDER — LACTATED RINGERS IV SOLN: INTRAVENOUS | Status: DC

## 2019-12-28 MED ORDER — HYDROCODONE-ACETAMINOPHEN 5-325 MG PO TABS
1.0000 | ORAL_TABLET | Freq: Once | ORAL | Status: AC
  Administered 2019-12-28: 11:00:00 1 via ORAL
  Filled 2019-12-28: qty 1

## 2019-12-28 MED ORDER — FENTANYL CITRATE (PF) 100 MCG/2ML IJ SOLN: 25.0000 ug | INTRAMUSCULAR | Status: DC | PRN

## 2019-12-28 MED ORDER — ACETAMINOPHEN 325 MG PO TABS: 650.0000 mg | ORAL_TABLET | Freq: Four times a day (QID) | ORAL | Status: DC | PRN

## 2019-12-28 MED ORDER — DEXAMETHASONE SODIUM PHOSPHATE 10 MG/ML IJ SOLN
INTRAMUSCULAR | Status: AC
  Filled 2019-12-28: qty 1

## 2019-12-28 MED ORDER — CHLORHEXIDINE GLUCONATE 4 % EX LIQD
60.0000 mL | Freq: Once | CUTANEOUS | Status: AC
  Administered 2019-12-28: 4 via TOPICAL
  Filled 2019-12-28: qty 60

## 2019-12-28 MED ORDER — MIDAZOLAM HCL 5 MG/5ML IJ SOLN
INTRAMUSCULAR | Status: DC | PRN
  Administered 2019-12-28: 2 mg via INTRAVENOUS

## 2019-12-28 MED ORDER — OXYCODONE HCL 5 MG PO TABS
10.0000 mg | ORAL_TABLET | ORAL | Status: DC | PRN
  Administered 2019-12-28: 23:00:00 5 mg via ORAL
  Administered 2019-12-28 – 2019-12-29 (×2): 10 mg via ORAL
  Filled 2019-12-28: qty 2
  Filled 2019-12-28: qty 3

## 2019-12-28 MED ORDER — ONDANSETRON HCL 4 MG/2ML IJ SOLN: 4.0000 mg | Freq: Four times a day (QID) | INTRAMUSCULAR | Status: DC | PRN

## 2019-12-28 SURGICAL SUPPLY — 69 items
BAG ZIPLOCK 12X15 (MISCELLANEOUS) ×3 IMPLANT
BIT DRILL 4.3 (BIT) ×2
BIT DRILL 4.3MM (BIT) ×1
BIT DRILL 4.3X300MM (BIT) IMPLANT
BIT DRILL LONG 3.3 (BIT) ×1 IMPLANT
BIT DRILL LONG 3.3MM (BIT) ×1
BIT DRILL QC 3.3X195 (BIT) ×2 IMPLANT
BLADE SAW SGTL 11.0X1.19X90.0M (BLADE) IMPLANT
BNDG COHESIVE 4X5 TAN STRL (GAUZE/BANDAGES/DRESSINGS) ×5 IMPLANT
BNDG ELASTIC 6X15 VLCR STRL LF (GAUZE/BANDAGES/DRESSINGS) ×4 IMPLANT
BNDG GAUZE ELAST 4 BULKY (GAUZE/BANDAGES/DRESSINGS) ×3 IMPLANT
CAP LOCK NCB (Cap) ×14 IMPLANT
CLOSURE WOUND 1/2 X4 (GAUZE/BANDAGES/DRESSINGS) ×1
COVER SURGICAL LIGHT HANDLE (MISCELLANEOUS) ×3 IMPLANT
COVER WAND RF STERILE (DRAPES) IMPLANT
DRAPE C-ARM 42X120 X-RAY (DRAPES) ×3 IMPLANT
DRAPE C-ARMOR (DRAPES) ×3 IMPLANT
DRAPE ORTHO SPLIT 77X108 STRL (DRAPES) ×4
DRAPE SHEET LG 3/4 BI-LAMINATE (DRAPES) ×2 IMPLANT
DRAPE SURG IRRIG POUCH 19X23 (DRAPES) ×4 IMPLANT
DRAPE SURG ORHT 6 SPLT 77X108 (DRAPES) IMPLANT
DRESSING AQUACEL AG SP 3.5X10 (GAUZE/BANDAGES/DRESSINGS) IMPLANT
DRSG AQUACEL AG ADV 3.5X14 (GAUZE/BANDAGES/DRESSINGS) ×2 IMPLANT
DRSG AQUACEL AG SP 3.5X10 (GAUZE/BANDAGES/DRESSINGS) ×3
DRSG EMULSION OIL 3X16 NADH (GAUZE/BANDAGES/DRESSINGS) ×1 IMPLANT
DURAPREP 26ML APPLICATOR (WOUND CARE) ×3 IMPLANT
ELECT REM PT RETURN 15FT ADLT (MISCELLANEOUS) ×3 IMPLANT
GAUZE SPONGE 4X4 12PLY STRL (GAUZE/BANDAGES/DRESSINGS) ×3 IMPLANT
GLOVE BIOGEL M 7.0 STRL (GLOVE) IMPLANT
GLOVE BIOGEL PI IND STRL 7.5 (GLOVE) ×1 IMPLANT
GLOVE BIOGEL PI IND STRL 8.5 (GLOVE) ×1 IMPLANT
GLOVE BIOGEL PI INDICATOR 7.5 (GLOVE) ×2
GLOVE BIOGEL PI INDICATOR 8.5 (GLOVE) ×2
GLOVE ECLIPSE 8.0 STRL XLNG CF (GLOVE) IMPLANT
GLOVE ORTHO TXT STRL SZ7.5 (GLOVE) ×6 IMPLANT
GLOVE SURG ORTHO 8.0 STRL STRW (GLOVE) ×3 IMPLANT
GOWN STRL REUS W/TWL LRG LVL3 (GOWN DISPOSABLE) ×3 IMPLANT
GOWN STRL REUS W/TWL XL LVL3 (GOWN DISPOSABLE) ×6 IMPLANT
IMMOBILIZER KNEE 20 (SOFTGOODS) ×3 IMPLANT
IMMOBILIZER KNEE 20 THIGH 36 (SOFTGOODS) IMPLANT
K-WIRE 2.0 (WIRE) ×2
K-WIRE FXSTD 280X2XNS SS (WIRE) ×1
KIT BASIN OR (CUSTOM PROCEDURE TRAY) ×3 IMPLANT
KIT TURNOVER KIT A (KITS) ×2 IMPLANT
KWIRE FXSTD 280X2XNS SS (WIRE) IMPLANT
MANIFOLD NEPTUNE II (INSTRUMENTS) ×3 IMPLANT
NS IRRIG 1000ML POUR BTL (IV SOLUTION) ×3 IMPLANT
PACK TOTAL JOINT (CUSTOM PROCEDURE TRAY) ×3 IMPLANT
PENCIL SMOKE EVACUATOR (MISCELLANEOUS) ×2 IMPLANT
PLATE FEM DIST NCB PP 278MM (Plate) ×2 IMPLANT
PROTECTOR NERVE ULNAR (MISCELLANEOUS) ×3 IMPLANT
SCREW 5.0 32MM (Screw) ×2 IMPLANT
SCREW 5.0 70MM (Screw) ×2 IMPLANT
SCREW 5.0 80MM (Screw) ×4 IMPLANT
SCREW NCB 3.5X75X5X6.2XST (Screw) IMPLANT
SCREW NCB 4.0 32MM (Screw) ×2 IMPLANT
SCREW NCB 5.0X34MM (Screw) ×2 IMPLANT
SCREW NCB 5.0X36MM (Screw) ×2 IMPLANT
SCREW NCB 5.0X75MM (Screw) ×4 IMPLANT
STAPLER VISISTAT 35W (STAPLE) ×5 IMPLANT
STRIP CLOSURE SKIN 1/2X4 (GAUZE/BANDAGES/DRESSINGS) ×2 IMPLANT
SUT MNCRL AB 4-0 PS2 18 (SUTURE) ×3 IMPLANT
SUT VIC AB 0 CT1 18XCR BRD 8 (SUTURE) IMPLANT
SUT VIC AB 0 CT1 8-18 (SUTURE) ×2
SUT VIC AB 1 CT1 36 (SUTURE) ×6 IMPLANT
SUT VIC AB 2-0 CT1 27 (SUTURE) ×4
SUT VIC AB 2-0 CT1 TAPERPNT 27 (SUTURE) ×2 IMPLANT
TOWEL OR 17X26 10 PK STRL BLUE (TOWEL DISPOSABLE) ×6 IMPLANT
WATER STERILE IRR 1000ML POUR (IV SOLUTION) ×1 IMPLANT

## 2019-12-28 NOTE — Anesthesia Preprocedure Evaluation (Signed)
Anesthesia Evaluation  Patient identified by MRN, date of birth, ID band Patient awake    Reviewed: Allergy & Precautions, H&P , NPO status , Patient's Chart, lab work & pertinent test results  Airway Mallampati: II   Neck ROM: full    Dental   Pulmonary  +COVID-19   breath sounds clear to auscultation       Cardiovascular hypertension,  Rhythm:regular Rate:Normal     Neuro/Psych    GI/Hepatic   Endo/Other    Renal/GU      Musculoskeletal  (+) Arthritis , Right distal femur fx   Abdominal   Peds  Hematology   Anesthesia Other Findings   Reproductive/Obstetrics                             Anesthesia Physical Anesthesia Plan  ASA: II  Anesthesia Plan: General   Post-op Pain Management:    Induction: Intravenous  PONV Risk Score and Plan: 3 and Ondansetron, Dexamethasone and Treatment may vary due to age or medical condition  Airway Management Planned: Oral ETT  Additional Equipment:   Intra-op Plan:   Post-operative Plan: Extubation in OR  Informed Consent: I have reviewed the patients History and Physical, chart, labs and discussed the procedure including the risks, benefits and alternatives for the proposed anesthesia with the patient or authorized representative who has indicated his/her understanding and acceptance.       Plan Discussed with: CRNA, Anesthesiologist and Surgeon  Anesthesia Plan Comments:         Anesthesia Quick Evaluation

## 2019-12-28 NOTE — Anesthesia Procedure Notes (Signed)
Procedure Name: Intubation Date/Time: 12/28/2019 7:17 PM Performed by: Gerald Leitz, CRNA Pre-anesthesia Checklist: Patient identified, Patient being monitored, Timeout performed, Emergency Drugs available and Suction available Patient Re-evaluated:Patient Re-evaluated prior to induction Oxygen Delivery Method: Circle system utilized Preoxygenation: Pre-oxygenation with 100% oxygen Induction Type: IV induction Ventilation: Mask ventilation without difficulty Laryngoscope Size: Mac and 3 Grade View: Grade I Tube type: Oral Tube size: 7.0 mm Number of attempts: 1 Placement Confirmation: ETT inserted through vocal cords under direct vision,  positive ETCO2 and breath sounds checked- equal and bilateral Secured at: 21 cm Tube secured with: Tape Dental Injury: Teeth and Oropharynx as per pre-operative assessment

## 2019-12-28 NOTE — ED Notes (Signed)
Patient taken to X-Ray.

## 2019-12-28 NOTE — ED Triage Notes (Signed)
Patient arrived by EMS from home. Patient had fall in driveway and fell on knee.   Patient denies LOC, back, or neck pain.   Patient denies being on blood thinners.

## 2019-12-28 NOTE — ED Provider Notes (Signed)
[facility]-EMERGENCY DEPT Provider Note   CSN: [id number] Arrival date & time: 12/28/19  5176     History Chief Complaint  Patient presents with  . Fall   Janet Mcdonald is a 74 y.o. female with significant PMH of HTN who presents today after a fall. She was walking next door to her mother's house when she slipped on a patch of black ice and fell onto her buttock. Denies loss of consciousness, head trauma, or hip/back pain. After her fall, pt experiences sudden onset R knee pain. When she fell, pt's R knee hyper flexed and she was unable to bear weight. States she heard a popping sound. Rates her current pain 2/10 as she is at rest. Pt has history of bilateral hip replacements. No on blood thinners.  The history is provided by the patient.  Fall This is a new problem. The current episode started 1 to 2 hours ago. Episode frequency: once. Pertinent negatives include no chest pain, no headaches and no shortness of breath. Associated symptoms comments: R knee pain. She has tried nothing for the symptoms.      Past Medical History:  Diagnosis Date  . Hypertension   . MRSA (methicillin resistant staph aureus) culture positive     There are no problems to display for this patient.   Past Surgical History:  Procedure Laterality Date  . ABDOMINAL HYSTERECTOMY    . APPENDECTOMY    . Bilateral hip replacment    . IR SINUS/FIST TUBE CHK-NON GI  06/26/2017     OB History   No obstetric history on file.     History reviewed. No pertinent family history.  Social History   Tobacco Use  . Smoking status: Never Smoker  . Smokeless tobacco: Never Used  Substance Use Topics  . Alcohol use: No  . Drug use: No    Home Medications Prior to Admission medications   Medication Sig Start Date End Date Taking? Authorizing Provider  aspirin EC 325 MG tablet Take 325 mg by mouth daily.    [provider]  calcium carbonate (TUMS - DOSED IN MG ELEMENTAL  CALCIUM) 500 MG chewable tablet Chew 1 tablet by mouth daily.    [provider]  Cinnamon 500 MG capsule Take 1,000 mg by mouth 2 (two) times daily.    [provider]  FLUZONE HIGH-DOSE 0.5 ML SUSY Inject 1 Dose as directed once. 08/15/15   [provider]  guaiFENesin (MUCINEX) 600 MG 12 hr tablet Take 600 mg by mouth daily as needed for cough.    [provider]  HYDROcodone-acetaminophen (NORCO/VICODIN) 5-325 MG tablet Take 1-2 tablets by mouth every 4 (four) hours as needed for moderate pain or severe pain. 08/27/16   [name], PA-C  linezolid (ZYVOX) 600 MG tablet Take 1 tablet (600 mg total) by mouth 2 (two) times daily. 07/13/17   Donnetta Hutching, MD  lisinopril-hydrochlorothiazide (PRINZIDE,ZESTORETIC) 20-25 MG per tablet Take 1 tablet by mouth every morning.    [provider]  metoprolol (LOPRESSOR) 100 MG tablet Take 100 mg by mouth 2 (two) times daily.    [provider]  Multiple Vitamin (MULTIVITAMIN WITH MINERALS) TABS tablet Take 1 tablet by mouth daily.    [provider]  naproxen sodium (ANAPROX) 220 MG tablet Take 220 mg by mouth daily as needed (aches).     [provider]  PREVNAR 13 SUSP injection Inject 1 Dose as directed once. 08/03/15   [provider]  Turmeric 500 MG CAPS Take 1 capsule by mouth 2 (two) times daily.    [provider]    Allergies    Oxycodone and Tetracyclines & related  Review of Systems   Review of Systems  Constitutional: Negative for chills, fatigue and fever.  HENT: Negative for congestion and sinus pressure.   Eyes: Negative for photophobia and visual disturbance.  Respiratory: Negative for cough and shortness of breath.   Cardiovascular: Negative for chest pain and palpitations.  Gastrointestinal: Negative for diarrhea, nausea and vomiting.  Genitourinary: Negative for dysuria and frequency.  Musculoskeletal:       R knee pain  Skin: Negative.     Neurological: Negative for weakness and headaches.   Physical Exam Updated Vital Signs BP (!) 188/91 (BP Location: Left Arm)   Pulse 72   Temp 97.9 F (36.6 C) (Oral)   Resp 16   Ht 5\' 3"  (1.6 m)   Wt 70.3 kg   SpO2 100%   BMI 27.46 kg/m   Physical Exam Vitals and nursing note reviewed.  Constitutional:      General: She is not in acute distress.    Appearance: Normal appearance. She is not ill-appearing.  HENT:     Head: Normocephalic and atraumatic.     Mouth/Throat:     Mouth: Mucous membranes are moist.  Eyes:     Conjunctiva/sclera: Conjunctivae normal.  Cardiovascular:     Rate and Rhythm: Normal rate and regular rhythm.     Pulses:          Dorsalis pedis pulses are 1+ on the right side and 1+ on the left side.     Heart sounds: Normal heart sounds.  Pulmonary:     Effort: Pulmonary effort is normal.     Breath sounds: Normal breath sounds.  Abdominal:     General: Abdomen is flat. Bowel sounds are normal.     Palpations: Abdomen is soft.  Musculoskeletal:       Legs:  Skin:    General: Skin is warm and dry.     Comments: No overlying ecchymosis, abrasion, or laceration over R knee.  Neurological:     General: No focal deficit present.     Mental Status: She is alert and oriented to person, place, and time.  Psychiatric:        Mood and Affect: Mood normal.    ED Results / Procedures / Treatments   Labs (all labs ordered are listed, but only abnormal results are displayed) Labs Reviewed  COMPREHENSIVE METABOLIC PANEL - Abnormal; Notable for the following components:      Result Value   Glucose, Bld 140 (*)    All other components within normal limits  CBC WITH DIFFERENTIAL/PLATELET - Abnormal; Notable for the following components:   WBC 12.2 (*)    Neutro Abs 10.6 (*)    All other components within normal limits    EKG None  Radiology DG Chest 1 View  Result Date: 12/28/2019 CLINICAL DATA:  Preoperative for femur fracture repair EXAM:  CHEST  1 VIEW COMPARISON:  None. FINDINGS: Normal heart size. Normal mediastinal contour. No pneumothorax. No pleural effusion. Lungs appear clear, with no acute consolidative airspace disease and no pulmonary edema. IMPRESSION: No active disease. Electronically Signed   By: 12/30/2019 M.D.   On: 12/28/2019 11:53   DG Knee Complete 4 Views Right  Result Date: 12/28/2019 CLINICAL DATA:  Fall, knee pain EXAM: RIGHT KNEE - COMPLETE 4+ VIEW COMPARISON:  None. FINDINGS: Oblique fracture through the distal shaft of the right femur with mild displacement and angulation. A joint effusion is present. 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 IMPRESSION: Acute oblique fracture of the distal shaft of the right femur. Electronically Signed   By: Macy Mis M.D.   On: 12/28/2019 11:16    Procedures Procedures (including critical care time)  Medications Ordered in ED Medications  HYDROcodone-acetaminophen (NORCO/VICODIN) 5-325 MG per tablet 1 tablet (1 tablet Oral Given 12/28/19 1052)    ED Course  I have reviewed the triage vital signs and the nursing notes.  Pertinent labs & imaging results that were available during my care of the patient were reviewed by me and considered in my medical decision making (see chart for details).    MDM Rules/Calculators/A&P                      Ms. Quang is a 74 y.o. female with significant PMH of HTN who presents today after a mechanical fall. Pt slipped on her driveway and hyperflexed her R knee resulting in a oblique fracture of the distal right femur. She last ate at 7am today and is not on any blood thinners. Pt stated her pain was controlled with Norco 5-325mg  PO once. Will order for 1mg  Dilaudid q2h PRN.  Discussed case with Dr. Doran Durand who will admit and take to the OR later this afternoon. Pt is NPO. COVID test pending.   Final Clinical Impression(s) / ED Diagnoses Final diagnoses:  None    Rx / DC Orders ED Discharge Orders    None        Ladona Horns, MD 12/28/19 [place], DO 12/28/19 1330

## 2019-12-28 NOTE — H&P (Signed)
Janet Mcdonald is an 74 y.o. female.   Chief Complaint: Right lower extremity pain HPI: The patient is a 74 year old female without significant past medical history.  She fell this morning walking down her mother's driveway when she hit a patch of black ice.  She describes a hyperflexion injury of her right lower extremity.  She was unable to bear weight afterwards.  She came to the emergency room via EMS.  Radiographs reveal a distal femur fracture.  She last ate at 7 this morning.  She does not take any blood thinners aside from aspirin.  She is not a smoker.  No history of diabetes.  She complains of aching pain in the thigh that is sharp and more severe when she moves at all.  She has a history of bilateral hip replacement.  Past Medical History:  Diagnosis Date  . Hypertension   . MRSA (methicillin resistant staph aureus) culture positive     Past Surgical History:  Procedure Laterality Date  . ABDOMINAL HYSTERECTOMY    . APPENDECTOMY    . Bilateral hip replacment    . IR SINUS/FIST TUBE CHK-NON GI  06/26/2017    History reviewed. No pertinent family history. Social History:  reports that she has never smoked. She has never used smokeless tobacco. She reports that she does not drink alcohol or use drugs.  Allergies:  Allergies  Allergen Reactions  . Oxycodone Other (See Comments)    Psychosis  . Tetracyclines & Related Nausea And Vomiting    (Not in a hospital admission)   Results for orders placed or performed during the hospital encounter of 12/28/19 (from the past 48 hour(s))  Comprehensive metabolic panel     Status: Abnormal   Collection Time: 12/28/19 11:24 AM  Result Value Ref Range   Sodium 141 135 - 145 mmol/L   Potassium 3.5 3.5 - 5.1 mmol/L   Chloride 103 98 - 111 mmol/L   CO2 26 22 - 32 mmol/L   Glucose, Bld 140 (H) 70 - 99 mg/dL    Comment: Glucose reference range applies only to samples taken after fasting for at least 8 hours.   BUN 17 8 - 23 mg/dL   Creatinine, Ser 0.67 0.44 - 1.00 mg/dL   Calcium 9.2 8.9 - 10.3 mg/dL   Total Protein 7.3 6.5 - 8.1 g/dL   Albumin 4.1 3.5 - 5.0 g/dL   AST 27 15 - 41 U/L   ALT 18 0 - 44 U/L   Alkaline Phosphatase 80 38 - 126 U/L   Total Bilirubin 0.6 0.3 - 1.2 mg/dL   GFR calc non Af Amer >60 >60 mL/min   GFR calc Af Amer >60 >60 mL/min   Anion gap 12 5 - 15    Comment: Performed at [facility], [place] [address], [place],  [address]  CBC with Differential     Status: Abnormal   Collection Time: 12/28/19 11:24 AM  Result Value Ref Range   WBC 12.2 (H) 4.0 - 10.5 K/uL   RBC 5.01 3.87 - 5.11 MIL/uL   Hemoglobin 14.4 12.0 - 15.0 g/dL   HCT 42.5 36.0 - 46.0 %   MCV 84.8 80.0 - 100.0 fL   MCH 28.7 26.0 - 34.0 pg   MCHC 33.9 30.0 - 36.0 g/dL   RDW 13.1 11.5 - 15.5 %   Platelets 224 150 - 400 K/uL   nRBC 0.0 0.0 - 0.2 %   Neutrophils Relative % 87 %  Neutro Abs 10.6 (H) 1.7 - 7.7 K/uL   Lymphocytes Relative 7 %   Lymphs Abs 0.9 0.7 - 4.0 K/uL   Monocytes Relative 4 %   Monocytes Absolute 0.5 0.1 - 1.0 K/uL   Eosinophils Relative 1 %   Eosinophils Absolute 0.2 0.0 - 0.5 K/uL   Basophils Relative 0 %   Basophils Absolute 0.0 0.0 - 0.1 K/uL   Immature Granulocytes 1 %   Abs Immature Granulocytes 0.06 0.00 - 0.07 K/uL    Comment: Performed at [facility], [address] [place], [place] [address]   DG Chest 1 View  Result Date: 01/23/20 CLINICAL DATA:  Preoperative for femur fracture repair EXAM: CHEST  1 VIEW COMPARISON:  None. FINDINGS: Normal heart size. Normal mediastinal contour. No pneumothorax. No pleural effusion. Lungs appear clear, with no acute consolidative airspace disease and no pulmonary edema. IMPRESSION: No active disease. Electronically Signed   By: Delbert Phenix M.D.   On: January 23, 2020 11:53   DG Knee Complete 4 Views Right  Result Date: Jan 23, 2020 CLINICAL DATA:  Fall, knee pain EXAM: RIGHT KNEE - COMPLETE 4+ VIEW COMPARISON:  None.  FINDINGS: Oblique fracture through the distal shaft of the right femur with mild displacement and angulation. A joint effusion is present. 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 IMPRESSION: Acute oblique fracture of the distal shaft of the right femur. Electronically Signed   By: Guadlupe Spanish M.D.   On: Jan 23, 2020 11:16    Review of Systems no recent fever, chills, nausea, vomiting or changes in her appetite  Blood pressure (!) 188/91, pulse 72, temperature 97.9 F (36.6 C), temperature source Oral, resp. rate 16, height 5\' 3"  (1.6 m), weight 70.3 kg, SpO2 100 %. Physical Exam  Well-nourished well-developed woman in no apparent distress.  Alert and oriented x4.  Mood and affect are normal.  Extraocular motions are intact.  Respirations are unlabored.  Gait is nonweightbearing on the right lower extremity.  Right thigh slightly swollen.  There is an effusion at the knee.  Skin is healthy and intact.  No lymphadenopathy.  1+ dorsalis pedis pulse.  Intact sensibility to light touch dorsally and plantarly at the forefoot.  Active plantar flexion and dorsiflexion strength at the toes.  Assessment/Plan Right distal femur fracture - to the operating room this afternoon for open treatment with intramedullary nailing.  Covid test is ordered.  NPO.  Patient understands the risks and benefits the alternative treatment options and elect surgical treatment.  She specifically understands risks of bleeding, infection, nerve damage, blood clots, nonunion, continued pain, amputation and death.  , MD 2020-01-23, 12:55 PM

## 2019-12-28 NOTE — Op Note (Signed)
12/28/2019  8:57 PM  PATIENT:  Janet Mcdonald  74 y.o. female  PRE-OPERATIVE DIAGNOSIS:  right distal femur fracture  POST-OPERATIVE DIAGNOSIS:  right distal femur fracture  Procedure(s):  Open treatment of right distal femur fracture with internal fixation  SURGEON:  Toni Arthurs, MD  ASSISTANT: Alfredo Martinez, PA-C  ANESTHESIA:   General  EBL:  200 cc  TOURNIQUET:  n/a  COMPLICATIONS:  None apparent  DISPOSITION:  Extubated, awake and stable to recovery.  INDICATION FOR PROCEDURE: Patient is a 74 year old female with a past medical history significant for a right total hip replacement in the remote past.  She fell on ice this morning sustaining a hyper flexion injury of her knee.  She came to the emergency department where radiographs revealed a supracondylar fracture of her right distal femur.  She presents now for operative treatment of this displaced and unstable femur fracture.  The femoral stem of her total hip replacement is quite long and reaches the isthmus of the femoral shaft making an intramedullary nail impractical.The risks and benefits of the alternative treatment options have been discussed in detail.  The patient wishes to proceed with surgery and specifically understands risks of bleeding, infection, nerve damage, blood clots, need for additional surgery, amputation and death.  PROCEDURE IN DETAIL:  After pre operative consent was obtained, and the correct operative site was identified, the patient was brought to the operating room and placed supine on the OR table.  General  anesthesia was administered.  Pre-operative antibiotics were administered.  A surgical timeout was taken.  The right lower extremity was prepped and draped in standard sterile fashion.  Reduction was performed with longitudinal traction and internal/external rotation.  AP and lateral radiographs confirmed appropriate reduction of the fracture.  A 12 hole [employer] distal femoral plate was placed  lateral to the thigh.  A radiograph confirmed that the plate extended from the appropriate position at the knee proximal to the tip of the femoral stem.  An incision was then made at the distal lateral thigh.  Dissection was carried down through the subcutaneous tissues to the IT band.  The IT band was split and extended distally and proximally allowing adequate exposure of the distal femur.  The plate was then inserted in submuscular fashion along the lateral femoral cortex.  It was positioned appropriately at the distal femur and provisionally pinned.  The drill guide was then inserted percutaneously to the lateral femoral cortex just distal to the femoral stem.  A 3.2 mm drill bit was inserted in bicortical fashion to position the plate appropriately.  AP and lateral radiographs confirmed appropriate position of the plate.  Distally drill holes were made at the lateral femoral condyle.  5 bicortical screws were inserted through the plate and into the distal femur.  All were noted to have appropriate purchase.  The targeting guide was then used to insert for bicortical screws at the proximal end of the plate just distal to the femoral stem.  AP and lateral radiographs confirmed appropriate reduction of the fracture in appropriate position and length of all hardware.  In the proximal end of the plate the 2 central screws were locked to the plate.  All 5 distal screws were locked to the plate.  Final radiographs were obtained again showing appropriate reduction and appropriate position of the length of all hardware.  The wound was irrigated copiously.  Vancomycin powder was placed adjacent to the plate.  The IT band was repaired with 0  Vicryl.  Subcutaneous tissues were approximated with 2-0 Vicryl.  Skin incisions were closed with staples.  Sterile dressings were applied followed by a compression wrap and a knee immobilizer.  The patient was awakened from anesthesia and transported to the recovery room in stable  condition.   FOLLOW UP PLAN: The patient will be admitted to the hospitalist service since she is Covid positive.  She will be nonweightbearing of the right lower extremity.  She can begin DVT prophylaxis on postop day 1.  PT, OT and discharge planning consultations have been ordered.     Mechele Claude PA-C was present and scrubbed for the duration of the operative case. His assistance was essential in positioning the patient, prepping and draping, gaining and maintaining exposure, performing the operation, closing and dressing the wounds and applying the splint.

## 2019-12-28 NOTE — H&P (Addendum)
History and Physical    Janet Mcdonald:[id number] DOB: [birth year] DOA: 12/28/2019  PCP: Hoyt Koch, MD (Inactive)  Patient coming from: Home  I have personally briefly reviewed patient's old medical records in [facility]  Chief Complaint: Right leg pain  HPI: Janet Mcdonald is a 74 y.o. female with medical history significant of essential hypertension who presents to the ED following fall today at home.  Patient was walking down her mother's driveway when she slipped on a patch of ice with hyperflexion of her right lower extremity underneath her body.  She was unable to bear weight following the events with significant pain.  Denies any loss of consciousness.  ED Course: Temperature 97.9, HR 69, RR 17, BP 155/99, SPO2 97% on room air.  The BC count 12.2, hemoglobin 14.4, platelets 224.  Sodium 141, potassium 3.5, chloride 103, CO2 26, BUN 17, creatinine 0.67, glucose 140.  Covid-19+.  Rapid influenza negative.  Chest x-ray with no acute disease process.  Right knee x-ray with fracture right femur distal shaft.  ED consulted orthopedics, Dr. Victorino Dike who plans on operative management this evening.  TRH consulted for admission.  Review of Systems: As per HPI otherwise 10 point review of systems negative.    Past Medical History:  Diagnosis Date  . Hypertension   . MRSA (methicillin resistant staph aureus) culture positive     Past Surgical History:  Procedure Laterality Date  . ABDOMINAL HYSTERECTOMY    . APPENDECTOMY    . Bilateral hip replacment    . IR SINUS/FIST TUBE CHK-NON GI  06/26/2017     reports that she has never smoked. She has never used smokeless tobacco. She reports that she does not drink alcohol or use drugs.  Allergies  Allergen Reactions  . Oxycodone Other (See Comments)    Psychosis  . Tetracyclines & Related Nausea And Vomiting    History reviewed. No pertinent family history.  Family history reviewed and not pertinent   Prior to  Admission medications   Medication Sig Start Date End Date Taking? Authorizing Provider  amoxicillin-clavulanate (AUGMENTIN) 875-125 MG tablet Take 0.05 tablets by mouth once.  11/17/19  Yes [provider]  aspirin EC 81 MG tablet Take 81 mg by mouth daily.    Yes [provider]  calcium carbonate (TUMS - DOSED IN MG ELEMENTAL CALCIUM) 500 MG chewable tablet Chew 500 mg by mouth daily.    Yes [provider]  lisinopril-hydrochlorothiazide (PRINZIDE,ZESTORETIC) 20-25 MG per tablet Take 1 tablet by mouth every morning.   Yes [provider]  metoprolol (LOPRESSOR) 100 MG tablet Take 100 mg by mouth 2 (two) times daily.   Yes [provider]  Multiple Vitamin (MULTIVITAMIN WITH MINERALS) TABS tablet Take 1 tablet by mouth at bedtime.    Yes [provider]  Turmeric 500 MG CAPS Take 2,000 mg by mouth daily at 12 noon.    Yes [provider]  HYDROcodone-acetaminophen (NORCO/VICODIN) 5-325 MG tablet Take 1-2 tablets by mouth every 4 (four) hours as needed for moderate pain or severe pain. Patient not taking: Reported on 12/28/2019 08/27/16   Janet Dredge, PA-C  linezolid (ZYVOX) 600 MG tablet Take 1 tablet (600 mg total) by mouth 2 (two) times daily. Patient not taking: Reported on 12/28/2019 07/13/17   Donnetta Hutching, MD    Physical Exam: Vitals:   12/28/19 1303 12/28/19 1500 12/28/19 1530 12/28/19 1630  BP: (!) 184/89  (!) 143/96 (!) 155/99  Pulse: 74 65  74 69  Resp: 13 15 12 17   Temp:      TempSrc:      SpO2: 100% 97% 91% 97%  Weight:      Height:        Constitutional: NAD, calm, comfortable Eyes: PERRL, lids and conjunctivae normal ENMT: Mucous membranes are moist. Posterior pharynx clear of any exudate or lesions.Normal dentition.  Neck: normal, supple, no masses, no thyromegaly Respiratory: clear to auscultation bilaterally, no wheezing, no crackles. Normal respiratory effort. No accessory muscle use.  Oxygenating well on  room air. Cardiovascular: Regular rate and rhythm, no murmurs / rubs / gallops. No extremity edema. 2+ pedal pulses. No carotid bruits.  Abdomen: no tenderness, no masses palpated. No hepatosplenomegaly. Bowel sounds positive.  Musculoskeletal: no clubbing / cyanosis. Right distal femur with mild edema. No significant tenderness, on ice pack. Normal muscle tone.  Skin: no rashes, lesions, ulcers. No induration Neurologic: CN 2-12 grossly intact. Sensation intact, DTR normal. Strength 5/5 in all 4.  Psychiatric: Normal judgment and insight. Alert and oriented x 3. Normal mood.    Labs on Admission: I have personally reviewed following labs and imaging studies  CBC: Recent Labs  Lab 12/28/19 1124  WBC 12.2*  NEUTROABS 10.6*  HGB 14.4  HCT 42.5  MCV 84.8  PLT 224   Basic Metabolic Panel: Recent Labs  Lab 12/28/19 1124  NA 141  K 3.5  CL 103  CO2 26  GLUCOSE 140*  BUN 17  CREATININE 0.67  CALCIUM 9.2   GFR: Estimated Creatinine Clearance: 60.7 mL/min (by C-G formula based on SCr of 0.67 mg/dL). Liver Function Tests: Recent Labs  Lab 12/28/19 1124  AST 27  ALT 18  ALKPHOS 80  BILITOT 0.6  PROT 7.3  ALBUMIN 4.1   No results for input(s): LIPASE, AMYLASE in the last 168 hours. No results for input(s): AMMONIA in the last 168 hours. Coagulation Profile: No results for input(s): INR, PROTIME in the last 168 hours. Cardiac Enzymes: No results for input(s): CKTOTAL, CKMB, CKMBINDEX, TROPONINI in the last 168 hours. BNP (last 3 results) No results for input(s): PROBNP in the last 8760 hours. HbA1C: No results for input(s): HGBA1C in the last 72 hours. CBG: No results for input(s): GLUCAP in the last 168 hours. Lipid Profile: No results for input(s): CHOL, HDL, LDLCALC, TRIG, CHOLHDL, LDLDIRECT in the last 72 hours. Thyroid Function Tests: No results for input(s): TSH, T4TOTAL, FREET4, T3FREE, THYROIDAB in the last 72 hours. Anemia Panel: No results for input(s):  VITAMINB12, FOLATE, FERRITIN, TIBC, IRON, RETICCTPCT in the last 72 hours. Urine analysis:    Component Value Date/Time   COLORURINE YELLOW 07/13/2017 1419   APPEARANCEUR HAZY (A) 07/13/2017 1419   LABSPEC 1.018 07/13/2017 1419   PHURINE 5.0 07/13/2017 1419   GLUCOSEU NEGATIVE 07/13/2017 1419   HGBUR NEGATIVE 07/13/2017 1419   BILIRUBINUR NEGATIVE 07/13/2017 1419   KETONESUR 5 (A) 07/13/2017 1419   PROTEINUR NEGATIVE 07/13/2017 1419   UROBILINOGEN 1.0 11/04/2014 1617   NITRITE NEGATIVE 07/13/2017 1419   LEUKOCYTESUR LARGE (A) 07/13/2017 1419    Radiological Exams on Admission: DG Chest 1 View  Result Date: 12/28/2019 CLINICAL DATA:  Preoperative for femur fracture repair EXAM: CHEST  1 VIEW COMPARISON:  None. FINDINGS: Normal heart size. Normal mediastinal contour. No pneumothorax. No pleural effusion. Lungs appear clear, with no acute consolidative airspace disease and no pulmonary edema. IMPRESSION: No active disease. Electronically Signed   By: 12/30/2019 M.D.   On: 12/28/2019 11:53  DG Knee Complete 4 Views Right  Result Date: 12/28/2019 CLINICAL DATA:  Fall, knee pain EXAM: RIGHT KNEE - COMPLETE 4+ VIEW COMPARISON:  None. FINDINGS: Oblique fracture through the distal shaft of the right femur with mild displacement and angulation. A joint effusion is present. 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 IMPRESSION: Acute oblique fracture of the distal shaft of the right femur. Electronically Signed   By: Macy Mis M.D.   On: 12/28/2019 11:16    EKG: Independently reviewed.   Assessment/Plan Active Problems:   Femur fracture, right ([place])    Right femur fracture Patient presenting from home following fall after slipping on ice at home.  X-ray with right femur distal shaft fracture.  No loss of consciousness. --Orthopedics planning operative management this evening --N.p.o. --Check vitamin D 25 level --PT/OT consultation in a.m. --Postoperative DVT prophylaxis  per orthopedics --Pain control with Norco, Robaxin, Dilaudid prn  Essential hypertension BP 155/99 on presentation. --Continue home lisinopril-HCTZ 20-25 mg p.o. daily --Hydralazine prn for SBP >170 or SBP >120  Covid-19 viral infection Patient reports history of Covid-19 infection back in September 2020, was asymptomatic during that event.  Previous exposure history included patient's mother's caretakers who turned out being positive.  Repeat Covid-19 test on admission today remains positive.  No symptoms, oxygenating well on room air.  Chest x-ray without acute disease process.  Rapid influenza negative. --Continue to monitor oxygen saturations closely --Continue airborne/contact isolation   DVT prophylaxis: SCDs Code Status: Full code Family Communication: Updated patient extensively at bedside, declines calling family as she has already updated them Disposition Plan: To to be determined, currently lives at home with husband who has advanced Parkinson's disease and is his caregiver.  Daughter lives close. Consults called: Orthopedics, Dr. Doran Durand by ED provider Admission status: Inpatient   Severity of Illness: The appropriate patient status for this patient is INPATIENT. Inpatient status is judged to be reasonable and necessary in order to provide the required intensity of service to ensure the patient's safety. The patient's presenting symptoms, physical exam findings, and initial radiographic and laboratory data in the context of their chronic comorbidities is felt to place them at high risk for further clinical deterioration. Furthermore, it is not anticipated that the patient will be medically stable for discharge from the hospital within 2 midnights of admission. The following factors support the patient status of inpatient.   " The patient's presenting symptoms include right leg pain " The worrisome physical exam findings include tenderness to palpation right upper leg " The  initial radiographic and laboratory data are worrisome because of right distal femur fracture " The chronic co-morbidities include hypertension.   * I certify that at the point of admission it is my clinical judgment that the patient will require inpatient hospital care spanning beyond 2 midnights from the point of admission due to high intensity of service, high risk for further deterioration and high frequency of surveillance required.*     J [place] DO [facility] Available via Epic secure chat 7am-7pm After these hours, please refer to coverage provider listed on [email or web address] 12/28/2019, 5:42 PM

## 2019-12-28 NOTE — ED Notes (Signed)
RN notified of abnormal lab 

## 2019-12-28 NOTE — Discharge Instructions (Signed)
Janet Hewitt, MD [facility]  Please read the following information regarding your care after surgery.  Medications  You only need a prescription for the narcotic pain medicine (ex. oxycodone, Percocet, Norco).  All of the other medicines listed below are available over the counter. X Aleve 2 pills twice a day for the first 3 days after surgery. X acetominophen (Tylenol) 650 mg every 4-6 hours as you need for minor to moderate pain X oxycodone as prescribed for severe pain  Narcotic pain medicine (ex. oxycodone, Percocet, Vicodin) will cause constipation.  To prevent this problem, take the following medicines while you are taking any pain medicine. X docusate sodium (Colace) 100 mg twice a day X senna (Senokot) 2 tablets twice a day  X To help prevent blood clots, take a baby aspirin (81 mg) twice a day after surgery.  You should also get up every hour while you are awake to move around.    Weight Bearing X Do not bear any weight on the operated leg or foot.  Cast / Splint / Dressing X Keep your splint, cast or dressing clean and dry.  Don't put anything (coat hanger, pencil, etc) down inside of it.  If it gets damp, use a hair dryer on the cool setting to dry it.  If it gets soaked, call the office to schedule an appointment for a cast change.   After your dressing, cast or splint is removed; you may shower, but do not soak or scrub the wound.  Allow the water to run over it, and then gently pat it dry.  Swelling It is normal for you to have swelling where you had surgery.  To reduce swelling and pain, keep your toes above your nose for at least 3 days after surgery.  It may be necessary to keep your foot or leg elevated for several weeks.  If it hurts, it should be elevated.  Follow Up Call my office at [phone] when you are discharged from the hospital or surgery center to schedule an appointment to be seen two weeks after surgery.  Call my office at [phone] if you develop a  fever >101.5 F, nausea, vomiting, bleeding from the surgical site or severe pain.     

## 2019-12-28 NOTE — Plan of Care (Signed)
  Problem: Education: Goal: Knowledge of General Education information will improve Description: Including pain rating scale, medication(s)/side effects and non-pharmacologic comfort measures Outcome: Progressing   Problem: Health Behavior/Discharge Planning: Goal: Ability to manage health-related needs will improve Outcome: Progressing   Problem: Clinical Measurements: Goal: Ability to maintain clinical measurements within normal limits will improve Outcome: Progressing Goal: Will remain free from infection Outcome: Progressing Goal: Diagnostic test results will improve Outcome: Progressing Goal: Respiratory complications will improve Outcome: Progressing Goal: Cardiovascular complication will be avoided Outcome: Progressing   Problem: Coping: Goal: Level of anxiety will decrease Outcome: Progressing   Problem: Elimination: Goal: Will not experience complications related to urinary retention Outcome: Progressing   Problem: Pain Managment: Goal: General experience of comfort will improve Outcome: Progressing   Problem: Safety: Goal: Ability to remain free from injury will improve Outcome: Progressing   Problem: Skin Integrity: Goal: Risk for impaired skin integrity will decrease Outcome: Progressing   

## 2019-12-28 NOTE — Transfer of Care (Addendum)
Immediate Anesthesia Transfer of Care Note  Patient: Janet Mcdonald  Procedure(s) Performed: Procedure(s): OPEN REDUCTION INTERNAL FIXATION (ORIF) DISTAL FEMUR FRACTURE (Right)  Patient Location: OR Covid recovery  Anesthesia Type:General  Level of Consciousness: Alert, Awake, Oriented  Airway & Oxygen Therapy: Patient Spontanous Breathing  Post-op Assessment: Report given to RN  Post vital signs: Reviewed and stable  Last Vitals: 12/28/19 2118 156/80 HR 76 RR 12 SPO2 99 on 6 L O2  Complications: No apparent anesthesia complications

## 2019-12-29 DIAGNOSIS — S72471A Torus fracture of lower end of right femur, initial encounter for closed fracture: Secondary | ICD-10-CM

## 2019-12-29 LAB — BASIC METABOLIC PANEL
Anion gap: 9 (ref 5–15)
BUN: 16 mg/dL (ref 8–23)
CO2: 26 mmol/L (ref 22–32)
Calcium: 8.2 mg/dL — ABNORMAL LOW (ref 8.9–10.3)
Chloride: 100 mmol/L (ref 98–111)
Creatinine, Ser: 0.52 mg/dL (ref 0.44–1.00)
GFR calc Af Amer: >60 mL/min (ref >60)
GFR calc non Af Amer: >60 mL/min (ref >60)
Glucose, Bld: 195 mg/dL — ABNORMAL HIGH (ref 70–99)
Potassium: 3.3 mmol/L — ABNORMAL LOW (ref 3.5–5.1)
Sodium: 135 mmol/L (ref 135–145)

## 2019-12-29 LAB — CBC
HCT: 34.5 % — ABNORMAL LOW (ref 36.0–46.0)
Hemoglobin: 11.7 g/dL — ABNORMAL LOW (ref 12.0–15.0)
MCH: 29.1 pg (ref 26.0–34.0)
MCHC: 33.9 g/dL (ref 30.0–36.0)
MCV: 85.8 fL (ref 80.0–100.0)
Platelets: 174 10*3/uL (ref 150–400)
RBC: 4.02 MIL/uL (ref 3.87–5.11)
RDW: 13.1 % (ref 11.5–15.5)
WBC: 10.1 10*3/uL (ref 4.0–10.5)
nRBC: 0 % (ref 0.0–0.2)

## 2019-12-29 MED ORDER — SENNA 8.6 MG PO TABS: 2.0000 | ORAL_TABLET | Freq: Two times a day (BID) | ORAL | 0 refills | Status: DC

## 2019-12-29 MED ORDER — ADULT MULTIVITAMIN W/MINERALS CH
1.0000 | ORAL_TABLET | Freq: Every day | ORAL | Status: DC
  Administered 2019-12-29 – 2019-12-31 (×3): 1 via ORAL
  Filled 2019-12-29 (×3): qty 1

## 2019-12-29 MED ORDER — ENSURE SURGERY PO LIQD
237.0000 mL | Freq: Two times a day (BID) | ORAL | Status: DC
  Administered 2019-12-29 – 2019-12-31 (×4): 237 mL via ORAL
  Filled 2019-12-29 (×6): qty 237

## 2019-12-29 MED ORDER — ALUM & MAG HYDROXIDE-SIMETH 200-200-20 MG/5ML PO SUSP
30.0000 mL | ORAL | Status: DC | PRN
  Administered 2019-12-29: 21:00:00 30 mL via ORAL
  Filled 2019-12-29: qty 30

## 2019-12-29 MED ORDER — ASPIRIN EC 81 MG PO TBEC: 81.0000 mg | DELAYED_RELEASE_TABLET | Freq: Two times a day (BID) | ORAL | 0 refills | Status: DC

## 2019-12-29 MED ORDER — DOCUSATE SODIUM 100 MG PO CAPS: 100.0000 mg | ORAL_CAPSULE | Freq: Every day | ORAL | 2 refills | Status: AC | PRN

## 2019-12-29 MED ORDER — OXYCODONE HCL 5 MG PO TABS: 5.0000 mg | ORAL_TABLET | ORAL | 0 refills | Status: AC | PRN

## 2019-12-29 MED ORDER — POTASSIUM CHLORIDE CRYS ER 20 MEQ PO TBCR
40.0000 meq | EXTENDED_RELEASE_TABLET | Freq: Once | ORAL | Status: AC
  Administered 2019-12-29: 07:00:00 40 meq via ORAL
  Filled 2019-12-29: qty 2

## 2019-12-29 NOTE — Progress Notes (Signed)
Charge nurse spoke with Infectious Disease re pt's current positive Covid status and how it would effect her husband at home with Parkinson's. He needs sitters at home while she is in Rehab and the [facility] is not sure about coming into the home given that she is still showing Covid positive and he having been exposed to her. Md offered help with a note for Home Health Agency. Melton Alar, RN

## 2019-12-29 NOTE — Plan of Care (Signed)

## 2019-12-29 NOTE — Progress Notes (Signed)
Subjective: 1 Day Post-Op Procedure(s) (LRB): OPEN REDUCTION INTERNAL FIXATION (ORIF) DISTAL FEMUR FRACTURE (Right)  Patient reports pain as mild.  Reports that her R leg feels much better today.  Denies fever, chills, N/V, CP, SOB.  Admits to flatus.  Reports that she is ready for breakfast.  Objective:   VITALS:  Temp:  [95.9 F (35.5 C)-99.3 F (37.4 C)] 99.3 F (37.4 C) (02/24 0701) Pulse Rate:  [65-87] 87 (02/24 0701) Resp:  [12-19] 18 (02/24 0701) BP: (122-188)/(68-100) 122/68 (02/24 0701) SpO2:  [91 %-100 %] 97 % (02/24 0701) Weight:  [70.3 kg] 70.3 kg (02/23 1017)  General: WDWN patient in NAD. Psych:  Appropriate mood and affect. Neuro:  A&O x 3, Moving all extremities, sensation intact to light touch HEENT:  EOMs intact Chest:  Even non-labored respirations Skin: Dressing and KI C/D/I, no rashes or lesions Extremities: warm/dry, no edema, erythema or echymosis.  No lymphadenopathy. Pulses: Dorsal pedis and posterior tibialis 2+ MSK:  ROM: TKE, MMT: able to perform quad set, (-) Homan's    LABS Recent Labs    12/28/19 1124 12/29/19 0311  HGB 14.4 11.7*  WBC 12.2* 10.1  PLT 224 174   Recent Labs    12/28/19 1124 12/29/19 0311  NA 141 135  K 3.5 3.3*  CL 103 100  CO2 26 26  BUN 17 16  CREATININE 0.67 0.52  GLUCOSE 140* 195*   No results for input(s): LABPT, INR in the last 72 hours.   Assessment/Plan: 1 Day Post-Op Procedure(s) (LRB): OPEN REDUCTION INTERNAL FIXATION (ORIF) DISTAL FEMUR FRACTURE (Right)  NWB R LE Up with therapy Knee Immobilizer at all times Disp: pending DVT ppx: lovenox in house, transition to 81mg  ASA upon D/C D/C scripts on chart. Plan for 2 week outpatient post-op visit with Dr. .  Victorino Dike PA-C [facility] Office:  [phone]

## 2019-12-29 NOTE — Evaluation (Signed)
Occupational Therapy Evaluation Patient Details Name: Janet Mcdonald MRN: [id number] DOB: Nov 06, 1945 Today's Date: 12/29/2019    History of Present Illness 74 year old with past medical history significant for hypertension who presents to the ED after mechanical fall at home.  Patient was walking down her mother's driveway when she slipped on a patch of ice with hyperflexion of her right lower extremity underneath her body.  She was unable to bear weight following the events with significant pain.  Evaluation in the ED right knee x-ray showed fracture of the right femur distal shaft.  Chest x-ray was negative for acute process.  Orthopedic was consulted and patient underwent open treatment of right distal femur fracture with internal fixation on 12/28/2019. Of note patient has persistent COVID-19 viral test positive.   Clinical Impression   Pt admitted with fall and the above. Pt currently with functional limitations due to the deficits listed below (see OT Problem List).  Pt will benefit from skilled OT to increase their safety and independence with ADL and functional mobility for ADL to facilitate discharge to venue listed below.   Pt cares for husband who has Parkinsons and will need post acute rehab prior to DC home.  Pt not able to care for herself and husband with NWB status     Follow Up Recommendations  SNF          Precautions / Restrictions Precautions Precautions: Fall Required Braces or Orthoses: Knee Immobilizer - Right Knee Immobilizer - Right: On at all times Restrictions Weight Bearing Restrictions: Yes RLE Weight Bearing: Non weight bearing      Mobility Bed Mobility Overal bed mobility: Needs Assistance Bed Mobility: Supine to Sit     Supine to sit: Min assist     General bed mobility comments: up to recliner by OT  Transfers Overall transfer level: Needs assistance Equipment used: Rolling walker (2 wheeled) Transfers: Sit to/from [employer] Sit to Stand: Mod assist Stand pivot transfers: Mod assist       General transfer comment: requiring assist for rise and steady, pt used rocking technique, cues for hand placement, assist to transition to RW    Balance Overall balance assessment: History of Falls                                         ADL either performed or assessed with clinical judgement   ADL Overall ADL's : Needs assistance/impaired Eating/Feeding: Set up;Sitting   Grooming: Wash/dry hands;Set up;Wash/dry face;Sitting   Upper Body Bathing: Set up;Sitting   Lower Body Bathing: Moderate assistance;Sit to/from stand;Cueing for safety;Cueing for compensatory techniques;Cueing for sequencing   Upper Body Dressing : Set up;Sitting   Lower Body Dressing: Sit to/from stand;Cueing for safety;Cueing for compensatory techniques;Cueing for sequencing;Maximal assistance   Toilet Transfer: Moderate assistance;RW;Stand-pivot;Cueing for safety;Cueing for sequencing Toilet Transfer Details (indicate cue type and reason): bed to chair Toileting- Clothing Manipulation and Hygiene: Maximal assistance;Sit to/from stand;Cueing for safety;Cueing for compensatory techniques;Cueing for sequencing         General ADL Comments: pt will need post acute rehab to increase I with ADL activity prior to DC home                  Pertinent Vitals/Pain Pain Assessment: 0-10 Pain Score: 7  Pain Location: R leg with mobility only Pain Descriptors / Indicators: Pounding;Aching;Grimacing Pain Intervention(s): Limited activity within patient's tolerance;Monitored  during session     Hand Dominance     Extremity/Trunk Assessment Upper Extremity Assessment Upper Extremity Assessment: Generalized weakness   Lower Extremity Assessment Lower Extremity Assessment: RLE deficits/detail RLE: Unable to fully assess due to immobilization       Communication Communication Communication: No difficulties    Cognition Arousal/Alertness: Awake/alert Behavior During Therapy: WFL for tasks assessed/performed Overall Cognitive Status: Within Functional Limits for tasks assessed                                                Home Living Family/patient expects to be discharged to:: Skilled nursing facility Living Arrangements: Spouse/significant other   Type of Home: House Home Access: Stairs to enter [employer] of Steps: 3   Home Layout: Able to live on main level with bedroom/bathroom     Bathroom Shower/Tub: Tub/shower unit         Home Equipment: None   Additional Comments: Pt's spouse requires assistance, pt has no DME of her own      Prior Functioning/Environment Level of Independence: Independent                 OT Problem List: Decreased strength;Decreased activity tolerance;Impaired balance (sitting and/or standing);Pain;Decreased knowledge of precautions;Decreased knowledge of use of DME or AE;Decreased safety awareness      OT Treatment/Interventions: Self-care/ADL training;Patient/family education;DME and/or AE instruction;Therapeutic activities    OT Goals(Current goals can be found in the care plan section) Acute Rehab OT Goals Patient Stated Goal: go to rehab and get well so i can care for my husband OT Goal Formulation: With patient Time For Goal Achievement: 01/04/20 ADL Goals Pt Will Perform Lower Body Bathing: with supervision;sit to/from stand Pt Will Perform Lower Body Dressing: with supervision;sit to/from stand Pt Will Transfer to Toilet: with supervision;bedside commode;stand pivot transfer Pt Will Perform Toileting - Clothing Manipulation and hygiene: with supervision;sit to/from stand  OT Frequency: Min 2X/week    AM-PAC OT "6 Clicks" Daily Activity     Outcome Measure Help from another person eating meals?: None Help from another person taking care of personal grooming?: None Help from another person toileting,  which includes using toliet, bedpan, or urinal?: A Lot Help from another person bathing (including washing, rinsing, drying)?: A Lot Help from another person to put on and taking off regular upper body clothing?: A Little Help from another person to put on and taking off regular lower body clothing?: A Lot 6 Click Score: 17   End of Session Equipment Utilized During Treatment: Rolling walker Nurse Communication: Mobility status;Weight bearing status;Precautions  Activity Tolerance: Patient tolerated treatment well Patient left: in chair  OT Visit Diagnosis: History of falling (Z91.81);Unsteadiness on feet (R26.81);Muscle weakness (generalized) (M62.81);Other abnormalities of gait and mobility (R26.89)                Time: 1416-1430 OT Time Calculation (min): 14 min Charges:  OT General Charges $OT Visit: 1 Visit OT Evaluation $OT Eval Moderate Complexity: 1 Mod  Lise Auer, OT Acute Rehabilitation Services [phone] Office- [phone]     , Karin Golden D 12/29/2019, 4:43 PM

## 2019-12-29 NOTE — TOC Progression Note (Signed)
Transition of Care [facility]) - Progression Note    Patient Details  Name: Janet Mcdonald MRN: [id number] Date of Birth: [birth year]  [facility] CM/SW Contact  Geni Bers, RN Phone Number: 12/29/2019, 1:07 PM  Clinical Narrative:    Spoke with pt concerning discharge needs. Pt agreed with waiting for PT eval before determining home with [facility] or SNF for rehab.    Expected Discharge Plan: Home w Home Health Services Barriers to Discharge: No Barriers Identified  Expected Discharge Plan and Services Expected Discharge Plan: Home w Home Health Services   Discharge Planning Services: CM Consult   Living arrangements for the past 2 months: Single Family Home                                       Social Determinants of Health (SDOH) Interventions    Readmission Risk Interventions No flowsheet data found.

## 2019-12-29 NOTE — Progress Notes (Addendum)
PROGRESS NOTE    Janet Mcdonald  ZOX:[id number] DOB: 1946/05/25 DOA: 12/28/2019 PCP: Hoyt Koch, MD (Inactive)   Brief Narrative: 74 year old with past medical history significant for hypertension who presents to the ED after mechanical fall at home.  Patient was walking down her mother's driveway when she slipped on a patch of ice with hyperflexion of her right lower extremity underneath her body.  She was unable to bear weight following the events with significant pain.  Evaluation in the ED right knee x-ray showed fracture of the right femur distal shaft.  Chest x-ray was negative for acute process.  Orthopedic was consulted and patient underwent open treatment of right distal femur fracture with internal fixation on 12/28/2019 by Dr. He with.  Of note patient has persistent COVID-19 viral test positive.   Assessment & Plan:   Active Problems:   Femur fracture, right (HCC)   1-Right femoral fracture: After mechanical fall. Patient underwent open treatment of right distal femoral fracture with internal fixation and 12/28/2019. Lovenox for DVT prophylaxis. PT per orthopedic Orthopedic following.  2-Hypertension: Continue with lisinopril and hydrochlorothiazide  3-COVID-19 viral infection: Patient has a history of COVID-19 back in September 2020, she was asymptomatic at that time.  Repeat COVID-19 test on admission today remain positive.  No symptoms. On airborne contact precaution.   4-Hypokalemia: Replete orally. 5-Acute blood loss anemia, post surgery expected.  Monitor hemoglobin. 6-Leukocytosis resolved.  Nutrition Problem: Increased nutrient needs Etiology: acute illness, post-op healing    Signs/Symptoms: estimated needs    Interventions: Magic cup, Ensure Surgery, MVI  Estimated body mass index is 27.46 kg/m as calculated from the following:   Height as of this encounter: 5\' 3"  (1.6 m).   Weight as of this encounter: 70.3 kg.   DVT prophylaxis:  Lovenox Code Status: Full code Family Communication: Care discussed with patient Disposition Plan:  Patient is from: Home Anticipated d/c date: In 24 hours Barriers to d/c or necessity for inpatient status: Awaiting PT evaluation, patient will likely require skilled nursing facility  Consultants:   Ortho   Procedures:  Open treatment of right distal femur fracture with internal fixation  Antimicrobials:  none  Subjective: She is feeling well. She denies any respiratory symptoms. She had covid in September.  Right leg pain is controlled.   Objective: Vitals:   12/29/19 0033 12/29/19 0209 12/29/19 0701 12/29/19 1215  BP:  (!) 149/82 122/68 134/64  Pulse:  79 87 94  Resp:  14 18 20   Temp: (!) 97.1 F (36.2 C) 97.6 F (36.4 C) 99.3 F (37.4 C) 98.4 F (36.9 C)  TempSrc: Rectal Rectal Oral Oral  SpO2:  97% 97%   Weight:      Height:        Intake/Output Summary (Last 24 hours) at 12/29/2019 1219 Last data filed at 12/29/2019 0600 Gross per 24 hour  Intake 1841.44 ml  Output 75 ml  Net 1766.44 ml   Filed Weights   12/28/19 1017  Weight: 70.3 kg    Examination:  General exam: Appears calm and comfortable  Respiratory system: Clear to auscultation. Respiratory effort normal. Cardiovascular system: S1 & S2 heard, RRR. No JVD, murmurs, rubs, gallops or clicks. No pedal edema. Gastrointestinal system: Abdomen is nondistended, soft and nontender. No organomegaly or masses felt. Normal bowel sounds heard. Central nervous system: Alert and oriented. No focal neurological deficits. Extremities: Right LE with immobilizer in place.  Skin: No rashes, lesions or ulcers    Data Reviewed: I have  personally reviewed following labs and imaging studies  CBC: Recent Labs  Lab 12/28/19 1124 12/29/19 0311  WBC 12.2* 10.1  NEUTROABS 10.6*  --   HGB 14.4 11.7*  HCT 42.5 34.5*  MCV 84.8 85.8  PLT 224 132   Basic Metabolic Panel: Recent Labs  Lab 12/28/19 1124  12/29/19 0311  NA 141 135  K 3.5 3.3*  CL 103 100  CO2 26 26  GLUCOSE 140* 195*  BUN 17 16  CREATININE 0.67 0.52  CALCIUM 9.2 8.2*   GFR: Estimated Creatinine Clearance: 60.7 mL/min (by C-G formula based on SCr of 0.52 mg/dL). Liver Function Tests: Recent Labs  Lab 12/28/19 1124  AST 27  ALT 18  ALKPHOS 80  BILITOT 0.6  PROT 7.3  ALBUMIN 4.1   No results for input(s): LIPASE, AMYLASE in the last 168 hours. No results for input(s): AMMONIA in the last 168 hours. Coagulation Profile: No results for input(s): INR, PROTIME in the last 168 hours. Cardiac Enzymes: No results for input(s): CKTOTAL, CKMB, CKMBINDEX, TROPONINI in the last 168 hours. BNP (last 3 results) No results for input(s): PROBNP in the last 8760 hours. HbA1C: No results for input(s): HGBA1C in the last 72 hours. CBG: No results for input(s): GLUCAP in the last 168 hours. Lipid Profile: No results for input(s): CHOL, HDL, LDLCALC, TRIG, CHOLHDL, LDLDIRECT in the last 72 hours. Thyroid Function Tests: No results for input(s): TSH, T4TOTAL, FREET4, T3FREE, THYROIDAB in the last 72 hours. Anemia Panel: No results for input(s): VITAMINB12, FOLATE, FERRITIN, TIBC, IRON, RETICCTPCT in the last 72 hours. Sepsis Labs: No results for input(s): PROCALCITON, LATICACIDVEN in the last 168 hours.  Recent Results (from the past 240 hour(s))  Respiratory Panel by RT PCR (Flu A&B, Covid) - Nasopharyngeal Swab     Status: Abnormal   Collection Time: 12/28/19 12:54 PM   Specimen: Nasopharyngeal Swab  Result Value Ref Range Status   SARS Coronavirus 2 by RT PCR POSITIVE (A) NEGATIVE Final    Comment: RESULT CALLED TO, READ BACK BY AND VERIFIED WITH: S.WEST,RN [id number] @1426  BY V.WILKINS (NOTE) SARS-CoV-2 target nucleic acids are DETECTED. SARS-CoV-2 RNA is generally detectable in upper respiratory specimens  during the acute phase of infection. Positive results are indicative of the presence of the identified virus,  but do not rule out bacterial infection or co-infection with other pathogens not detected by the test. Clinical correlation with patient history and other diagnostic information is necessary to determine patient infection status. The expected result is Negative. Fact Sheet for Patients:  PinkCheek.be Fact Sheet for Healthcare Providers: GravelBags.it This test is not yet approved or cleared by the [place] FDA and  has been authorized for detection and/or diagnosis of SARS-CoV-2 by FDA under an Emergency Use Authorization (EUA).  This EUA will remain in effect (meaning this test can be used) f or the duration of  the COVID-19 declaration under Section 564(b)(1) of the Act, 21 U.S.C. section 360bbb-3(b)(1), unless the authorization is terminated or revoked sooner.    Influenza A by PCR NEGATIVE NEGATIVE Final   Influenza B by PCR NEGATIVE NEGATIVE Final    Comment: (NOTE) The Xpert Xpress SARS-CoV-2/FLU/RSV assay is intended as an aid in  the diagnosis of influenza from Nasopharyngeal swab specimens and  should not be used as a sole basis for treatment. Nasal washings and  aspirates are unacceptable for Xpert Xpress SARS-CoV-2/FLU/RSV  testing. Fact Sheet for Patients: PinkCheek.be Fact Sheet for Healthcare Providers: GravelBags.it This test is not yet  approved or cleared by the [place] and  has been authorized for detection and/or diagnosis of SARS-CoV-2 by  FDA under an Emergency Use Authorization (EUA). This EUA will remain  in effect (meaning this test can be used) for the duration of the  Covid-19 declaration under Section 564(b)(1) of the Act, 21  U.S.C. section 360bbb-3(b)(1), unless the authorization is  terminated or revoked. Performed at [facility], [address] [place], [place] [address]          Radiology  Studies: DG Chest 1 View  Result Date: 12/28/2019 CLINICAL DATA:  Preoperative for femur fracture repair EXAM: CHEST  1 VIEW COMPARISON:  None. FINDINGS: Normal heart size. Normal mediastinal contour. No pneumothorax. No pleural effusion. Lungs appear clear, with no acute consolidative airspace disease and no pulmonary edema. IMPRESSION: No active disease. Electronically Signed   By: Delbert Phenix M.D.   On: 12/28/2019 11:53   DG Knee Complete 4 Views Right  Result Date: 12/28/2019 CLINICAL DATA:  Fall, knee pain EXAM: RIGHT KNEE - COMPLETE 4+ VIEW COMPARISON:  None. FINDINGS: Oblique fracture through the distal shaft of the right femur with mild displacement and angulation. A joint effusion is present. 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 IMPRESSION: Acute oblique fracture of the distal shaft of the right femur. Electronically Signed   By: Guadlupe Spanish M.D.   On: 12/28/2019 11:16   DG C-Arm 1-60 Min-No Report  Result Date: 12/28/2019 Fluoroscopy was utilized by the requesting physician.  No radiographic interpretation.   DG FEMUR, MIN 2 VIEWS RIGHT  Result Date: 12/28/2019 CLINICAL DATA:  ORIF EXAM: RIGHT FEMUR 2 VIEWS COMPARISON:  12/28/2019 FINDINGS: Seven low resolution intraoperative spot views of the right femur. Total fluoroscopy time was 44 seconds. Incompletely visualized femoral portion of right hip arthroplasty. Lateral plate and fixating screws within the mid to distal femur across distal femoral fracture with anatomic alignment. IMPRESSION: Intraoperative fluoroscopic assistance provided during surgical fixation of distal femoral fracture Electronically Signed   By: Jasmine Pang M.D.   On: 12/28/2019 21:20        Scheduled Meds: . docusate sodium  100 mg Oral BID  . enoxaparin (LOVENOX) injection  40 mg Subcutaneous Q24H  . feeding supplement  237 mL Oral BID BM  . lisinopril  20 mg Oral Daily   And  . hydrochlorothiazide  25 mg Oral Daily  . metoprolol  tartrate  100 mg Oral BID  . multivitamin with minerals  1 tablet Oral Daily  . senna  1 tablet Oral BID   Continuous Infusions: . sodium chloride 75 mL/hr at 12/29/19 0600  . lactated ringers Stopped (12/28/19 2206)  . methocarbamol (ROBAXIN) IV       LOS: 1 day    Time spent:35 minutes.     Alba Cory, MD [facility]   If 7PM-7AM, please contact night-coverage [email or web address]  12/29/2019, 12:19 PM

## 2019-12-29 NOTE — Progress Notes (Signed)
Initial Nutrition Assessment  RD working remotely.   DOCUMENTATION CODES:   Not applicable  INTERVENTION:  - will order Ensure Surgery BID, each supplement provides 330 kcal and 18 grams protein. - will order Magic Cup with lunch meals, each supplement provides 290 kcal and 9 grams of protein. - will order daily multivitamin with minerals.    NUTRITION DIAGNOSIS:   Increased nutrient needs related to acute illness, post-op healing as evidenced by estimated needs.  GOAL:   Patient will meet greater than or equal to 90% of their needs  MONITOR:   PO intake, Supplement acceptance, Labs, Weight trends  REASON FOR ASSESSMENT:   Consult Assessment of nutrition requirement/status, Hip fracture protocol  ASSESSMENT:   74 y.o. female with medical history of essential HTN. She presented to the ED following a fall at home in which she slid on a patch of ice. She was unable to bear weight following the events with significant pain. She denied loss of consciousness.  Diet advanced from NPO to Regular on 2/23 at 2135. No intakes documented since that time. Patient on airborne and contact precautions. Weight on 2/23 was 155 lb. PTA, the most recently documented weight was on 07/13/17 when she weighed 154 lb.  Patient is POD #1 ORIF R femur.    Labs reviewed; K: 3.3 mmol/l, Ca: 8.2 mg/dl. Medications reviewed; 100 mg colace BID, 40 mEq Klor-Con x1 dose 2/24, 1 tablet senokot BID. IVF; NS @ 75 ml/hr.      NUTRITION - FOCUSED PHYSICAL EXAM:  unable to complete at this time.   Diet Order:   Diet Order            Diet regular Room service appropriate? Yes; Fluid consistency: Thin  Diet effective now              EDUCATION NEEDS:   No education needs have been identified at this time  Skin:  Skin Assessment: Skin Integrity Issues: Skin Integrity Issues:: Incisions Incisions: R leg 2/23  Last BM:  2/22  Height:   Ht Readings from Last 1 Encounters:  12/28/19 5\' 3"   (1.6 m)    Weight:   Wt Readings from Last 1 Encounters:  12/28/19 70.3 kg    Ideal Body Weight:  52.3 kg  BMI:  Body mass index is 27.46 kg/m.  Estimated Nutritional Needs:   Kcal:  1800-2050 kcal  Protein:  85-100 grams  Fluid:  >/= 2 L/day     12/30/19, MS, RD, LDN, CNSC Inpatient Clinical Dietitian RD pager # available in [facility]  After hours/weekend pager # available in [facility]

## 2019-12-29 NOTE — Evaluation (Signed)
Physical Therapy Evaluation Patient Details Name: Janet Mcdonald MRN: [id number] DOB: 02-21-1946 Today's Date: 12/29/2019   History of Present Illness  74 year old with past medical history significant for hypertension who presents to the ED after mechanical fall at home.  Patient was walking down her mother's driveway when she slipped on a patch of ice with hyperflexion of her right lower extremity underneath her body.  She was unable to bear weight following the events with significant pain.  Evaluation in the ED right knee x-ray showed fracture of the right femur distal shaft.  Chest x-ray was negative for acute process.  Orthopedic was consulted and patient underwent open treatment of right distal femur fracture with internal fixation on 12/28/2019. Of note patient has persistent COVID-19 viral test positive.  Clinical Impression  Patient is s/p above surgery resulting in functional limitations due to the deficits listed below (see PT Problem List).  Patient will benefit from skilled PT to increase their independence and safety with mobility to allow discharge to the venue listed below.  Pt requiring min-mod assist for mobility at this time and will likely need training for w/c mobility as pt is NWB on R LE and has weak UEs.  Pt aware of precautions to maintain NWB and keep on KI at all times.  Pt agreeable to SNF upon d/c to become more independent in order to return home (lives with spouse however he requires care and is not able to assist).     Follow Up Recommendations SNF    Equipment Recommendations  Wheelchair cushion (measurements PT);Wheelchair (measurements PT)    Recommendations for Other Services       Precautions / Restrictions Precautions Precautions: Fall Required Braces or Orthoses: Knee Immobilizer - Right Knee Immobilizer - Right: On at all times Restrictions Weight Bearing Restrictions: Yes RLE Weight Bearing: Non weight bearing      Mobility  Bed Mobility                General bed mobility comments: up to recliner by OT  Transfers Overall transfer level: Needs assistance Equipment used: Rolling walker (2 wheeled) Transfers: Sit to/from Stand Sit to Stand: Mod assist;+2 physical assistance         General transfer comment: requiring assist for rise and steady, pt used rocking technique, cues for hand placement, assist to transition to RW  Ambulation/Gait Ambulation/Gait assistance: Min assist;+2 safety/equipment Gait Distance (Feet): 4 Feet Assistive device: Rolling walker (2 wheeled)       General Gait Details: swing through technique, assist for steadying and due to weak UE strength; pt with occasional TDWB and reports pain with "hopping" so brought recliner behind pt  Stairs            Wheelchair Mobility    Modified Rankin (Stroke Patients Only)       Balance Overall balance assessment: History of Falls                                           Pertinent Vitals/Pain Pain Assessment: 0-10 Pain Score: 7  Pain Location: R leg with mobility only Pain Descriptors / Indicators: Pounding;Aching;Grimacing Pain Intervention(s): Repositioned;Monitored during session    Home Living Family/patient expects to be discharged to:: Private residence Living Arrangements: Spouse/significant other   Type of Home: House Home Access: Stairs to enter   Secretary/administrator of Steps: 3 Home Layout: Able to  live on main level with bedroom/bathroom   Additional Comments: Pt's spouse requires assistance, pt has no DME of her own    Prior Function Level of Independence: Independent               Hand Dominance        Extremity/Trunk Assessment   Upper Extremity Assessment Upper Extremity Assessment: Defer to OT evaluation(pt reports poor R wrist and "bad" shoulders)    Lower Extremity Assessment Lower Extremity Assessment: RLE deficits/detail RLE: Unable to fully assess due to  immobilization       Communication   Communication: No difficulties  Cognition Arousal/Alertness: Awake/alert Behavior During Therapy: WFL for tasks assessed/performed Overall Cognitive Status: Within Functional Limits for tasks assessed                                        General Comments      Exercises     Assessment/Plan    PT Assessment Patient needs continued PT services  PT Problem List Decreased strength;Decreased mobility;Decreased balance;Decreased knowledge of use of DME;Pain;Decreased knowledge of precautions;Decreased activity tolerance       PT Treatment Interventions DME instruction;Therapeutic exercise;Gait training;Balance training;Functional mobility training;Therapeutic activities;Patient/family education    PT Goals (Current goals can be found in the Care Plan section)  Acute Rehab PT Goals PT Goal Formulation: With patient Time For Goal Achievement: 01/05/20 Potential to Achieve Goals: Good    Frequency Min 3X/week   Barriers to discharge        Co-evaluation               AM-PAC PT "6 Clicks" Mobility  Outcome Measure Help needed turning from your back to your side while in a flat bed without using bedrails?: A Little Help needed moving from lying on your back to sitting on the side of a flat bed without using bedrails?: A Little Help needed moving to and from a bed to a chair (including a wheelchair)?: A Lot Help needed standing up from a chair using your arms (e.g., wheelchair or bedside chair)?: A Lot Help needed to walk in hospital room?: A Lot Help needed climbing 3-5 steps with a railing? : Total 6 Click Score: 13    End of Session Equipment Utilized During Treatment: Gait belt Activity Tolerance: Patient tolerated treatment well Patient left: in chair;with call bell/phone within reach(pt aware to use call bell and need for assist with mobility for safety)   PT Visit Diagnosis: Other abnormalities of gait and  mobility (R26.89);Pain Pain - Right/Left: Right Pain - part of body: Leg    Time: 1431-1457 PT Time Calculation (min) (ACUTE ONLY): 26 min   Charges:   PT Evaluation $PT Eval Low Complexity: 1 Low     Kati PT, DPT Acute Rehabilitation Services Office: [phone]  Trena Platt 12/29/2019, 3:58 PM

## 2019-12-30 ENCOUNTER — Inpatient Hospital Stay (HOSPITAL_COMMUNITY): Payer: Medicare HMO

## 2019-12-30 LAB — BASIC METABOLIC PANEL
Anion gap: 8 (ref 5–15)
BUN: 22 mg/dL (ref 8–23)
CO2: 29 mmol/L (ref 22–32)
Calcium: 8.4 mg/dL — ABNORMAL LOW (ref 8.9–10.3)
Chloride: 100 mmol/L (ref 98–111)
Creatinine, Ser: 0.71 mg/dL (ref 0.44–1.00)
GFR calc Af Amer: >60 mL/min (ref >60)
GFR calc non Af Amer: >60 mL/min (ref >60)
Glucose, Bld: 130 mg/dL — ABNORMAL HIGH (ref 70–99)
Potassium: 3.1 mmol/L — ABNORMAL LOW (ref 3.5–5.1)
Sodium: 137 mmol/L (ref 135–145)

## 2019-12-30 LAB — CBC
HCT: 29.8 % — ABNORMAL LOW (ref 36.0–46.0)
Hemoglobin: 9.9 g/dL — ABNORMAL LOW (ref 12.0–15.0)
MCH: 28.7 pg (ref 26.0–34.0)
MCHC: 33.2 g/dL (ref 30.0–36.0)
MCV: 86.4 fL (ref 80.0–100.0)
Platelets: 159 10*3/uL (ref 150–400)
RBC: 3.45 MIL/uL — ABNORMAL LOW (ref 3.87–5.11)
RDW: 13.4 % (ref 11.5–15.5)
WBC: 7.6 10*3/uL (ref 4.0–10.5)
nRBC: 0 % (ref 0.0–0.2)

## 2019-12-30 MED ORDER — FERROUS SULFATE 325 (65 FE) MG PO TABS
325.0000 mg | ORAL_TABLET | Freq: Two times a day (BID) | ORAL | Status: DC
  Administered 2019-12-30 – 2019-12-31 (×2): 325 mg via ORAL
  Filled 2019-12-30 (×2): qty 1

## 2019-12-30 MED ORDER — POLYETHYLENE GLYCOL 3350 17 G PO PACK
17.0000 g | PACK | Freq: Every day | ORAL | Status: DC
  Administered 2019-12-30 – 2019-12-31 (×2): 17 g via ORAL
  Filled 2019-12-30 (×2): qty 1

## 2019-12-30 MED ORDER — POTASSIUM CHLORIDE CRYS ER 20 MEQ PO TBCR
40.0000 meq | EXTENDED_RELEASE_TABLET | Freq: Once | ORAL | Status: AC
  Administered 2019-12-30: 40 meq via ORAL
  Filled 2019-12-30: qty 2

## 2019-12-30 MED ORDER — POTASSIUM CHLORIDE CRYS ER 20 MEQ PO TBCR
40.0000 meq | EXTENDED_RELEASE_TABLET | Freq: Once | ORAL | Status: AC
  Administered 2019-12-30: 17:00:00 40 meq via ORAL
  Filled 2019-12-30: qty 2

## 2019-12-30 NOTE — Progress Notes (Addendum)
PROGRESS NOTE    Janet Mcdonald  LOV:[id number] DOB: 1946/06/29 DOA: 12/28/2019 PCP: Caren Macadam, MD   Brief Narrative: 74 year old with past medical history significant for hypertension who presents to the ED after mechanical fall at home.  Patient was walking down her mother's driveway when she slipped on a patch of ice with hyperflexion of her right lower extremity underneath her body.  She was unable to bear weight following the events with significant pain.  Evaluation in the ED right knee x-ray showed fracture of the right femur distal shaft.  Chest x-ray was negative for acute process.  Orthopedic was consulted and patient underwent open treatment of right distal femur fracture with internal fixation on 12/28/2019 by Dr. He with.  Of note patient has persistent COVID-19 viral test positive.   Assessment & Plan:   Active Problems:   Femur fracture, right (HCC)   1-Right femoral fracture: After mechanical fall. Patient underwent open treatment of right distal femoral fracture with internal fixation and 12/28/2019. Lovenox for DVT prophylaxis. PT per orthopedic. Needs SNF. Orthopedic following. Requiring pain medication.   2-Hypertension: Continue with lisinopril and hydrochlorothiazide  3-COVID-19 viral infection: Patient has a history of COVID-19 back in September 2020, she was asymptomatic at that time.  Repeat COVID-19 test on admission today remain positive.  No symptoms. On airborne contact precaution.  Doubt she is actively infected.   4-Hypokalemia: Replete orally, 40 meq times 2.   5-Acute blood loss anemia, post surgery expected.  Monitor hemoglobin. Hb lower today.  Repeat labs in am.  Start ferrous sulfate.   6-Leukocytosis resolved. 7-Constipation; Added schedule Miralax.   Nutrition Problem: Increased nutrient needs Etiology: acute illness, post-op healing    Signs/Symptoms: estimated needs    Interventions: Magic cup, Ensure Surgery,  MVI  Estimated body mass index is 27.46 kg/m as calculated from the following:   Height as of this encounter: 5\' 3"  (1.6 m).   Weight as of this encounter: 70.3 kg.   DVT prophylaxis: Lovenox Code Status: Full code Family Communication: Care discussed with patient Disposition Plan:  Patient is from: Home Anticipated d/c date: In 24 hours Barriers to d/c or necessity for inpatient status: Awaiting PT evaluation, patient will likely require skilled nursing facility  Consultants:   Ortho   Procedures:  Open treatment of right distal femur fracture with internal fixation  Antimicrobials:  none  Subjective: Denies cough, sore throat. Complaining right leg pain. No BM yet,   Objective: Vitals:   12/29/19 1215 12/29/19 2025 12/30/19 0449 12/30/19 1154  BP: 134/64 (!) 145/80 132/69 128/75  Pulse: 94 88 82 81  Resp: 20 18 20 16   Temp: 98.4 F (36.9 C) 99.1 F (37.3 C) 99 F (37.2 C) 99.1 F (37.3 C)  TempSrc: Oral Oral Oral Oral  SpO2:  98% 96% 98%  Weight:      Height:        Intake/Output Summary (Last 24 hours) at 12/30/2019 1354 Last data filed at 12/30/2019 0600 Gross per 24 hour  Intake 0 ml  Output 900 ml  Net -900 ml   Filed Weights   12/28/19 1017  Weight: 70.3 kg    Examination:  General exam: NAD Respiratory system: CTA Cardiovascular system: S 1, S 2 RRR Gastrointestinal system: BS present, soft, nt Central nervous system: Non focal. . Extremities: Right LE ith immobilizer.    Data Reviewed: I have personally reviewed following labs and imaging studies  CBC: Recent Labs  Lab 12/28/19 1124 12/29/19 0311  12/30/19 0217  WBC 12.2* 10.1 7.6  NEUTROABS 10.6*  --   --   HGB 14.4 11.7* 9.9*  HCT 42.5 34.5* 29.8*  MCV 84.8 85.8 86.4  PLT 224 174 159   Basic Metabolic Panel: Recent Labs  Lab 12/28/19 1124 12/29/19 0311 12/30/19 0217  NA 141 135 137  K 3.5 3.3* 3.1*  CL 103 100 100  CO2 26 26 29   GLUCOSE 140* 195* 130*  BUN 17 16 22    CREATININE 0.67 0.52 0.71  CALCIUM 9.2 8.2* 8.4*   GFR: Estimated Creatinine Clearance: 60.7 mL/min (by C-G formula based on SCr of 0.71 mg/dL). Liver Function Tests: Recent Labs  Lab 12/28/19 1124  AST 27  ALT 18  ALKPHOS 80  BILITOT 0.6  PROT 7.3  ALBUMIN 4.1   No results for input(s): LIPASE, AMYLASE in the last 168 hours. No results for input(s): AMMONIA in the last 168 hours. Coagulation Profile: No results for input(s): INR, PROTIME in the last 168 hours. Cardiac Enzymes: No results for input(s): CKTOTAL, CKMB, CKMBINDEX, TROPONINI in the last 168 hours. BNP (last 3 results) No results for input(s): PROBNP in the last 8760 hours. HbA1C: No results for input(s): HGBA1C in the last 72 hours. CBG: No results for input(s): GLUCAP in the last 168 hours. Lipid Profile: No results for input(s): CHOL, HDL, LDLCALC, TRIG, CHOLHDL, LDLDIRECT in the last 72 hours. Thyroid Function Tests: No results for input(s): TSH, T4TOTAL, FREET4, T3FREE, THYROIDAB in the last 72 hours. Anemia Panel: No results for input(s): VITAMINB12, FOLATE, FERRITIN, TIBC, IRON, RETICCTPCT in the last 72 hours. Sepsis Labs: No results for input(s): PROCALCITON, LATICACIDVEN in the last 168 hours.  Recent Results (from the past 240 hour(s))  Respiratory Panel by RT PCR (Flu A&B, Covid) - Nasopharyngeal Swab     Status: Abnormal   Collection Time: 12/28/19 12:54 PM   Specimen: Nasopharyngeal Swab  Result Value Ref Range Status   SARS Coronavirus 2 by RT PCR POSITIVE (A) NEGATIVE Final    Comment: RESULT CALLED TO, READ BACK BY AND VERIFIED WITH: S.WEST,RN 12/30/19 @1426  BY V.WILKINS (NOTE) SARS-CoV-2 target nucleic acids are DETECTED. SARS-CoV-2 RNA is generally detectable in upper respiratory specimens  during the acute phase of infection. Positive results are indicative of the presence of the identified virus, but do not rule out bacterial infection or co-infection with other pathogens  not detected by the test. Clinical correlation with patient history and other diagnostic information is necessary to determine patient infection status. The expected result is Negative. Fact Sheet for Patients:  12/30/19 Fact Sheet for Healthcare Providers: 564332 This test is not yet approved or cleared by the FDA and  has been authorized for detection and/or diagnosis of SARS-CoV-2 by FDA under an Emergency Use Authorization (EUA).  This EUA will remain in effect (meaning this test can be used) f or the duration of  the COVID-19 declaration under Section 564(b)(1) of the Act, 21 U.S.C. section 360bbb-3(b)(1), unless the authorization is terminated or revoked sooner.    Influenza A by PCR NEGATIVE NEGATIVE Final   Influenza B by PCR NEGATIVE NEGATIVE Final    Comment: (NOTE) The Xpert Xpress SARS-CoV-2/FLU/RSV assay is intended as an aid in  the diagnosis of influenza from Nasopharyngeal swab specimens and  should not be used as a sole basis for treatment. Nasal washings and  aspirates are unacceptable for Xpert Xpress SARS-CoV-2/FLU/RSV  testing. Fact Sheet for Patients: https://www.moore.com/ Fact Sheet for Healthcare Providers: https://www.young.biz/ This test  is not yet approved or cleared by the [place] and  has been authorized for detection and/or diagnosis of SARS-CoV-2 by  FDA under an Emergency Use Authorization (EUA). This EUA will remain  in effect (meaning this test can be used) for the duration of the  Covid-19 declaration under Section 564(b)(1) of the Act, 21  U.S.C. section 360bbb-3(b)(1), unless the authorization is  terminated or revoked. Performed at [facility], [address], [place], [place] [address]          Radiology Studies: DG C-Arm 1-60 Min-No Report  Result Date: 12/28/2019 Fluoroscopy was  utilized by the requesting physician.  No radiographic interpretation.   DG FEMUR, MIN 2 VIEWS RIGHT  Result Date: 12/28/2019 CLINICAL DATA:  ORIF EXAM: RIGHT FEMUR 2 VIEWS COMPARISON:  12/28/2019 FINDINGS: Seven low resolution intraoperative spot views of the right femur. Total fluoroscopy time was 44 seconds. Incompletely visualized femoral portion of right hip arthroplasty. Lateral plate and fixating screws within the mid to distal femur across distal femoral fracture with anatomic alignment. IMPRESSION: Intraoperative fluoroscopic assistance provided during surgical fixation of distal femoral fracture Electronically Signed   By: Jasmine Pang M.D.   On: 12/28/2019 21:20        Scheduled Meds: . docusate sodium  100 mg Oral BID  . enoxaparin (LOVENOX) injection  40 mg Subcutaneous Q24H  . feeding supplement  237 mL Oral BID BM  . ferrous sulfate  325 mg Oral BID WC  . lisinopril  20 mg Oral Daily   And  . hydrochlorothiazide  25 mg Oral Daily  . metoprolol tartrate  100 mg Oral BID  . multivitamin with minerals  1 tablet Oral Daily  . polyethylene glycol  17 g Oral Daily  . potassium chloride  40 mEq Oral Once  . senna  1 tablet Oral BID   Continuous Infusions: . methocarbamol (ROBAXIN) IV       LOS: 2 days    Time spent:35 minutes.     Alba Cory, MD [facility]   If 7PM-7AM, please contact night-coverage [email or web address]  12/30/2019, 1:54 PM

## 2019-12-30 NOTE — Progress Notes (Signed)
Subjective: 2 Days Post-Op Procedure(s) (LRB): OPEN REDUCTION INTERNAL FIXATION (ORIF) DISTAL FEMUR FRACTURE (Right)  Patient reports pain as mild to moderate.  Tolerating POs well.  Admits to flatus.  Denies fever, chills, N/V, CP, SOB.  Reports that she worked well with therapy.  Objective:   VITALS:  Temp:  [98.4 F (36.9 C)-99.1 F (37.3 C)] 99 F (37.2 C) (02/25 0449) Pulse Rate:  [82-94] 82 (02/25 0449) Resp:  [18-20] 20 (02/25 0449) BP: (132-145)/(64-80) 132/69 (02/25 0449) SpO2:  [96 %-98 %] 96 % (02/25 0449)  General: WDWN patient in NAD. Psych:  Appropriate mood and affect. Neuro:  A&O x 3, Moving all extremities, sensation intact to light touch HEENT:  EOMs intact Chest:  Even non-labored respirations Skin:  Dressing and KI C/D/I, no rashes or lesions Extremities: warm/dry, no visible edema, erythema or echymosis.  No lymphadenopathy. Pulses: Popliteus 2+ MSK:  ROM: TKE, MMT: able to perform quad set, (-) Homan's    LABS Recent Labs    12/28/19 1124 12/28/19 1124 12/29/19 0311 12/30/19 0217  HGB 14.4  --  11.7* 9.9*  WBC 12.2*   < > 10.1 7.6  PLT 224   < > 174 159   < > = values in this interval not displayed.   Recent Labs    12/29/19 0311 12/30/19 0217  NA 135 137  K 3.3* 3.1*  CL 100 100  CO2 26 29  BUN 16 22  CREATININE 0.52 0.71  GLUCOSE 195* 130*   No results for input(s): LABPT, INR in the last 72 hours.   Assessment/Plan: 2 Days Post-Op Procedure(s) (LRB): OPEN REDUCTION INTERNAL FIXATION (ORIF) DISTAL FEMUR FRACTURE (Right)  NWB R LE Knee immobilizer at all times Up with therapy Disp: PT/OT recommend SNF DVT ppx: lovenox in house, transition to 81mg  ASA BID upon D/C D/C scripts on chart Plan for 2 week outpatient post-op visit with Dr. .  Victorino Dike PA-C [facility] Office:  [phone]

## 2019-12-30 NOTE — NC FL2 (Signed)
[place] MEDICAID FL2 LEVEL OF CARE SCREENING TOOL     IDENTIFICATION  Patient Name: Janet Mcdonald Birthdate: 1946/08/02 Sex: female Admission Date (Current Location): 12/28/2019  [facility] and [place] Number:  Producer, television/film/video and Address:  [facility],  [address], [place] [address]      Provider Number: [id number]  Attending Physician Name and Address:  Alba Cory, MD  Relative Name and Phone Number:       Current Level of Care: Hospital Recommended Level of Care: Skilled Nursing Facility Prior Approval Number:    Date Approved/Denied:   PASRR Number: [id number]  Discharge Plan: SNF    Current Diagnoses: Patient Active Problem List   Diagnosis Date Noted  . Femur fracture, right (HCC) 12/28/2019    Orientation RESPIRATION BLADDER Height & Weight     Self, Time, Situation, Place  Normal Continent Weight: 70.3 kg Height:  5\' 3"  (160 cm)  BEHAVIORAL SYMPTOMS/MOOD NEUROLOGICAL BOWEL NUTRITION STATUS      Continent Diet(Regular)  AMBULATORY STATUS COMMUNICATION OF NEEDS Skin   Extensive Assist Verbally Surgical wounds(right distal femur fracture)                       Personal Care Assistance Level of Assistance  Bathing, Feeding, Dressing Bathing Assistance: Limited assistance Feeding assistance: Independent Dressing Assistance: Limited assistance     Functional Limitations Info  Sight, Hearing, Speech Sight Info: Adequate Hearing Info: Adequate Speech Info: Adequate    SPECIAL CARE FACTORS FREQUENCY  PT (By licensed PT), OT (By licensed OT)     PT Frequency: x5 OT Frequency: x5            Contractures Contractures Info: Not present    Additional Factors Info  Code Status, Allergies Code Status Info: FULL Allergies Info: Oxycodone, Tetracyclines & Related           Current Medications (12/30/2019):  This is the current hospital active medication list Current Facility-Administered Medications   Medication Dose Route Frequency Provider Last Rate Last Admin  . acetaminophen (TYLENOL) tablet 325-650 mg  325-650 mg Oral Q6H PRN 01/01/2020, PA-C      . alum & mag hydroxide-simeth (MAALOX/MYLANTA) 200-200-20 MG/5ML suspension 30 mL  30 mL Oral Q4H PRN Regalado, Belkys A, MD   30 mL at 12/29/19 2037  . diphenhydrAMINE (BENADRYL) 12.5 MG/5ML elixir 12.5-25 mg  12.5-25 mg Oral Q4H PRN 12-04-1984, PA-C      . docusate sodium (COLACE) capsule 100 mg  100 mg Oral BID Jacinta Shoe, PA-C   100 mg at 12/30/19 1029  . enoxaparin (LOVENOX) injection 40 mg  40 mg Subcutaneous Q24H 01/01/20, PA-C   40 mg at 12/30/19 1031  . feeding supplement (ENSURE SURGERY) liquid 237 mL  237 mL Oral BID BM Regalado, Belkys A, MD   237 mL at 12/29/19 2030  . hydrALAZINE (APRESOLINE) injection 10 mg  10 mg Intravenous Q6H PRN 12/31/19, PA-C      . lisinopril (ZESTRIL) tablet 20 mg  20 mg Oral Daily Jacinta Shoe, PA-C   20 mg at 12/30/19 1030   And  . hydrochlorothiazide (HYDRODIURIL) tablet 25 mg  25 mg Oral Daily 01/01/20, PA-C   25 mg at 12/30/19 1030  . HYDROmorphone (DILAUDID) injection 0.5-1 mg  0.5-1 mg Intravenous Q4H PRN 01/01/20, PA-C      . lactated ringers infusion   Intravenous Continuous  Corky Sing, [place]   Stopped at 12/28/19 2206  . methocarbamol (ROBAXIN) tablet 500 mg  500 mg Oral Q6H PRN Corky Sing, PA-C       Or  . methocarbamol (ROBAXIN) 500 mg in dextrose 5 % 50 mL IVPB  500 mg Intravenous Q6H PRN Corky Sing, PA-C      . metoprolol tartrate (LOPRESSOR) tablet 100 mg  100 mg Oral BID Corky Sing, PA-C   100 mg at 12/30/19 1028  . multivitamin with minerals tablet 1 tablet  1 tablet Oral Daily Regalado, Belkys A, MD   1 tablet at 12/30/19 1031  . ondansetron (ZOFRAN) tablet 4 mg  4 mg Oral Q6H PRN Corky Sing, PA-C       Or  . ondansetron [facility]) injection 4 mg  4 mg Intravenous Q6H PRN Corky Sing, PA-C      . oxyCODONE (Oxy IR/ROXICODONE) immediate release tablet 10-15 mg  10-15 mg Oral Q4H PRN Corky Sing, PA-C   10 mg at 12/29/19 1714  . oxyCODONE (Oxy IR/ROXICODONE) immediate release tablet 5-10 mg  5-10 mg Oral Q4H PRN Corky Sing, PA-C   10 mg at 12/30/19 1059  . polyethylene glycol (MIRALAX / GLYCOLAX) packet 17 g  17 g Oral Daily PRN Corky Sing, PA-C      . senna (SENOKOT) tablet 8.6 mg  1 tablet Oral BID Corky Sing, PA-C   8.6 mg at 12/30/19 1029     Discharge Medications: Please see discharge summary for a list of discharge medications.  Relevant Imaging Results:  Relevant Lab Results:   Additional Information [id number]  Purcell Mouton, RN

## 2019-12-31 DIAGNOSIS — K5900 Constipation, unspecified: Secondary | ICD-10-CM | POA: Diagnosis not present

## 2019-12-31 DIAGNOSIS — S7291XD Unspecified fracture of right femur, subsequent encounter for closed fracture with routine healing: Secondary | ICD-10-CM | POA: Diagnosis not present

## 2019-12-31 DIAGNOSIS — M6281 Muscle weakness (generalized): Secondary | ICD-10-CM | POA: Diagnosis not present

## 2019-12-31 DIAGNOSIS — I10 Essential (primary) hypertension: Secondary | ICD-10-CM | POA: Diagnosis not present

## 2019-12-31 DIAGNOSIS — M2550 Pain in unspecified joint: Secondary | ICD-10-CM | POA: Diagnosis not present

## 2019-12-31 DIAGNOSIS — R262 Difficulty in walking, not elsewhere classified: Secondary | ICD-10-CM | POA: Diagnosis not present

## 2019-12-31 DIAGNOSIS — R509 Fever, unspecified: Secondary | ICD-10-CM | POA: Diagnosis not present

## 2019-12-31 DIAGNOSIS — D649 Anemia, unspecified: Secondary | ICD-10-CM | POA: Diagnosis not present

## 2019-12-31 DIAGNOSIS — S72471A Torus fracture of lower end of right femur, initial encounter for closed fracture: Secondary | ICD-10-CM | POA: Diagnosis not present

## 2019-12-31 DIAGNOSIS — S79929A Unspecified injury of unspecified thigh, initial encounter: Secondary | ICD-10-CM | POA: Diagnosis not present

## 2019-12-31 DIAGNOSIS — Z4789 Encounter for other orthopedic aftercare: Secondary | ICD-10-CM | POA: Diagnosis not present

## 2019-12-31 DIAGNOSIS — W19XXXA Unspecified fall, initial encounter: Secondary | ICD-10-CM | POA: Diagnosis not present

## 2019-12-31 DIAGNOSIS — Z7401 Bed confinement status: Secondary | ICD-10-CM | POA: Diagnosis not present

## 2019-12-31 DIAGNOSIS — D62 Acute posthemorrhagic anemia: Secondary | ICD-10-CM | POA: Diagnosis not present

## 2019-12-31 DIAGNOSIS — U071 COVID-19: Secondary | ICD-10-CM | POA: Diagnosis not present

## 2019-12-31 LAB — CBC
HCT: 31.2 % — ABNORMAL LOW (ref 36.0–46.0)
Hemoglobin: 10.5 g/dL — ABNORMAL LOW (ref 12.0–15.0)
MCH: 29.1 pg (ref 26.0–34.0)
MCHC: 33.7 g/dL (ref 30.0–36.0)
MCV: 86.4 fL (ref 80.0–100.0)
Platelets: 175 10*3/uL (ref 150–400)
RBC: 3.61 MIL/uL — ABNORMAL LOW (ref 3.87–5.11)
RDW: 13.1 % (ref 11.5–15.5)
WBC: 8 10*3/uL (ref 4.0–10.5)
nRBC: 0 % (ref 0.0–0.2)

## 2019-12-31 LAB — BASIC METABOLIC PANEL
Anion gap: 8 (ref 5–15)
BUN: 14 mg/dL (ref 8–23)
CO2: 26 mmol/L (ref 22–32)
Calcium: 8.4 mg/dL — ABNORMAL LOW (ref 8.9–10.3)
Chloride: 99 mmol/L (ref 98–111)
Creatinine, Ser: 0.44 mg/dL (ref 0.44–1.00)
GFR calc Af Amer: >60 mL/min (ref >60)
GFR calc non Af Amer: >60 mL/min (ref >60)
Glucose, Bld: 141 mg/dL — ABNORMAL HIGH (ref 70–99)
Potassium: 4.1 mmol/L (ref 3.5–5.1)
Sodium: 133 mmol/L — ABNORMAL LOW (ref 135–145)

## 2019-12-31 LAB — MAGNESIUM: Magnesium: 2 mg/dL (ref 1.7–2.4)

## 2019-12-31 MED ORDER — FERROUS SULFATE 325 (65 FE) MG PO TABS: 325.0000 mg | ORAL_TABLET | Freq: Two times a day (BID) | ORAL | 3 refills | Status: DC

## 2019-12-31 MED ORDER — BISACODYL 10 MG RE SUPP: 10.0000 mg | Freq: Once | RECTAL | Status: DC

## 2019-12-31 NOTE — Anesthesia Postprocedure Evaluation (Signed)
Anesthesia Post Note  Patient: Marcell Anger  Procedure(s) Performed: OPEN REDUCTION INTERNAL FIXATION (ORIF) DISTAL FEMUR FRACTURE (Right Leg Upper)     Patient location during evaluation: PACU Anesthesia Type: General Level of consciousness: awake and alert Pain management: pain level controlled Vital Signs Assessment: post-procedure vital signs reviewed and stable Respiratory status: spontaneous breathing, nonlabored ventilation, respiratory function stable and patient connected to nasal cannula oxygen Cardiovascular status: blood pressure returned to baseline and stable Postop Assessment: no apparent nausea or vomiting Anesthetic complications: no    Last Vitals:  Vitals:   12/30/19 2115 12/31/19 0437  BP: (!) 142/78 130/71  Pulse: 91 76  Resp:    Temp: 37 C 36.9 C  SpO2: 96% 96%    Last Pain:  Vitals:   12/31/19 0437  TempSrc: Oral  PainSc:    Pain Goal: Patients Stated Pain Goal: 2 (12/30/19 2340)                 , S

## 2019-12-31 NOTE — Progress Notes (Signed)
Subjective: 3 Days Post-Op Procedure(s) (LRB): OPEN REDUCTION INTERNAL FIXATION (ORIF) DISTAL FEMUR FRACTURE (Right)  Patient reports pain as mild to moderate.  Resting comfortably in bed.  Tolerating POs well.  Admits to flatus.  Denies fever, chills, N/V, CP, SOB.  Awaiting SNF placement.  Objective:   VITALS:  Temp:  [98.4 F (36.9 C)-99.1 F (37.3 C)] 98.4 F (36.9 C) (02/26 0437) Pulse Rate:  [76-91] 76 (02/26 0437) Resp:  [16] 16 (02/25 1154) BP: (128-142)/(71-78) 130/71 (02/26 0437) SpO2:  [96 %-98 %] 96 % (02/26 0437)  General: WDWN patient in NAD. Psych:  Appropriate mood and affect. Neuro:  A&O x 3, Moving all extremities, sensation intact to light touch HEENT:  EOMs intact Chest:  Even non-labored respirations Skin: Dressing and KI C/D/I, no rashes or lesions Extremities: warm/dry, no visible edema, erythema or echymosis.  No lymphadenopathy. Pulses: Popliteus 2+ MSK:  ROM: TKE, MMT: able to perform quad set, (-) Homan's    LABS Recent Labs    12/28/19 1124 12/28/19 1124 12/29/19 0311 12/29/19 0311 12/30/19 0217 12/31/19 0236  HGB 14.4  --  11.7*  --  9.9* 10.5*  WBC 12.2*   < > 10.1   < > 7.6 8.0  PLT 224   < > 174   < > 159 175   < > = values in this interval not displayed.   Recent Labs    12/30/19 0217 12/31/19 0236  NA 137 133*  K 3.1* 4.1  CL 100 99  CO2 29 26  BUN 22 14  CREATININE 0.71 0.44  GLUCOSE 130* 141*   No results for input(s): LABPT, INR in the last 72 hours.   Assessment/Plan: 3 Days Post-Op Procedure(s) (LRB): OPEN REDUCTION INTERNAL FIXATION (ORIF) DISTAL FEMUR FRACTURE (Right)  NWB R LE KI at all times Up with therapy D/C scripts on chart including narcotic, ASA for DVT ppx, and bowel regime. DVT ppx: lovenox in house Disp: SNF when bed available Plan for 2 week outpatient post-op visit with Dr. Lorrin Jackson [facility] Office:  [phone]

## 2019-12-31 NOTE — Discharge Summary (Signed)
Physician Discharge Summary  Janet Mcdonald PYP:[id number] DOB: 25-Jul-1946 DOA: 12/28/2019  PCP: Caren Macadam, MD  Admit date: 12/28/2019 Discharge date: 12/31/2019  Admitted From: Home  Disposition: SNF  Recommendations for Outpatient Follow-up:  1. Follow up with PCP in 1-2 weeks 2. Please obtain BMP/CBC in one week 3. Needs to follow up with DR Doran Durand in 2 weeks post op./     Discharge Condition: Stable CODE STATUS: Full code Diet recommendation: Heart Healthy  Brief/Interim Summary: 74 year old with past medical history significant for hypertension who presents to the ED after mechanical fall at home.  Patient was walking down her mother's driveway when she slipped on a patch of ice with hyperflexion of her right lower extremity underneath her body.  She was unable to bear weight following the events with significant pain.  Evaluation in the ED right knee x-ray showed fracture of the right femur distal shaft.  Chest x-ray was negative for acute process.  Orthopedic was consulted and patient underwent open treatment of right distal femur fracture with internal fixation on 12/28/2019 by Dr. He with.  Of note patient has persistent COVID-19 viral test positive.  1-Right femoral fracture: After mechanical fall. Patient underwent open treatment of right distal femoral fracture with internal fixation and 12/28/2019. Lovenox for DVT prophylaxis while in house. Aspirin at discharge per ortho.  PT per orthopedic. Needs SNF. Orthopedic following. Needs to follow up with Dr. Doran Durand in 2 weeks.  Requiring pain medication.   2-Hypertension: Continue with lisinopril and hydrochlorothiazide  3-COVID-19 viral infection: Patient has a history of COVID-19 back in September 2020, she was asymptomatic at that time.  Repeat COVID-19 test on admission today remain positive.  No symptoms. On airborne contact precaution.  Doubt she is actively infected.   4-Hypokalemia: Replaced.  Monitor  B-met  5-Acute blood loss anemia, post surgery expected.  Monitor hemoglobin. HB stable today at 10. Started  ferrous sulfate.   6-Leukocytosis resolved. 7-Constipation; Addeded schedule Miralax. suppository ordered.  Left Knee pain; x ray negative for fracture.   Discharge Diagnoses:  Active Problems:   Femur fracture, right [facility])    Discharge Instructions  Discharge Instructions    Diet - low sodium heart healthy   Complete by: As directed    Diet - low sodium heart healthy   Complete by: As directed    Increase activity slowly   Complete by: As directed    Increase activity slowly   Complete by: As directed      Allergies as of 12/31/2019      Reactions   Oxycodone Other (See Comments)   Psychosis   Tetracyclines & Related Nausea And Vomiting      Medication List    STOP taking these medications   HYDROcodone-acetaminophen 5-325 MG tablet Commonly known as: NORCO/VICODIN   linezolid 600 MG tablet Commonly known as: Zyvox     TAKE these medications   amoxicillin-clavulanate 875-125 MG tablet Commonly known as: AUGMENTIN Take 0.05 tablets by mouth once.   aspirin EC 81 MG tablet Take 1 tablet (81 mg total) by mouth 2 (two) times daily. What changed: when to take this   calcium carbonate 500 MG chewable tablet Commonly known as: TUMS - dosed in mg elemental calcium Chew 500 mg by mouth daily.   docusate sodium 100 MG capsule Commonly known as: Colace Take 1 capsule (100 mg total) by mouth daily as needed.   ferrous sulfate 325 (65 FE) MG tablet Take 1 tablet (325 mg total) by  mouth 2 (two) times daily with a meal.   lisinopril-hydrochlorothiazide 20-25 MG tablet Commonly known as: ZESTORETIC Take 1 tablet by mouth every morning.   metoprolol tartrate 100 MG tablet Commonly known as: LOPRESSOR Take 100 mg by mouth 2 (two) times daily.   multivitamin with minerals Tabs tablet Take 1 tablet by mouth at bedtime.   oxyCODONE 5 MG immediate  release tablet Commonly known as: Roxicodone Take 1 tablet (5 mg total) by mouth every 4 (four) hours as needed for up to 5 days for moderate pain or severe pain.   senna 8.6 MG Tabs tablet Commonly known as: SENOKOT Take 2 tablets (17.2 mg total) by mouth 2 (two) times daily.   Turmeric 500 MG Caps Take 2,000 mg by mouth daily at 12 noon.       Contact information for follow-up providers    Wylene Simmer, MD. Schedule an appointment as soon as possible for a visit in 2 weeks.   Specialty: Orthopedic Surgery Contact information: [address] [place] [address] [phone]            Contact information for after-discharge care    [facility] .   Service: Skilled Nursing Contact information: [address] [address] [phone]                 Allergies  Allergen Reactions  . Oxycodone Other (See Comments)    Psychosis  . Tetracyclines & Related Nausea And Vomiting    Consultations:  Dr Doran Durand.    Procedures/Studies: DG Chest 1 View  Result Date: 12/28/2019 CLINICAL DATA:  Preoperative for femur fracture repair EXAM: CHEST  1 VIEW COMPARISON:  None. FINDINGS: Normal heart size. Normal mediastinal contour. No pneumothorax. No pleural effusion. Lungs appear clear, with no acute consolidative airspace disease and no pulmonary edema. IMPRESSION: No active disease. Electronically Signed   By: Ilona Sorrel M.D.   On: 12/28/2019 11:53   DG Knee 1-2 Views Left  Result Date: 12/30/2019 CLINICAL DATA:  Left knee pain for 2 weeks EXAM: LEFT KNEE - 1-2 VIEW COMPARISON:  None. FINDINGS: Frontal and cross-table lateral views of the left knee are obtained. There is severe patellofemoral compartment osteoarthritis with severe joint space narrowing and osteophyte formation. Evaluation of the medial and lateral compartments somewhat limited on the frontal view due to flexion of the knee. There  is at least moderate joint space narrowing based on lateral projection. No acute fractures. Alignment is anatomic. No significant joint effusion. IMPRESSION: 1. 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 2. No acute fracture. Electronically Signed   By: Randa Ngo M.D.   On: 12/30/2019 19:13   DG Knee Complete 4 Views Right  Result Date: 12/28/2019 CLINICAL DATA:  Fall, knee pain EXAM: RIGHT KNEE - COMPLETE 4+ VIEW COMPARISON:  None. FINDINGS: Oblique fracture through the distal shaft of the right femur with mild displacement and angulation. A joint effusion is present. 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 IMPRESSION: Acute oblique fracture of the distal shaft of the right femur. Electronically Signed   By: Macy Mis M.D.   On: 12/28/2019 11:16   DG C-Arm 1-60 Min-No Report  Result Date: 12/28/2019 Fluoroscopy was utilized by the requesting physician.  No radiographic interpretation.   DG FEMUR, MIN 2 VIEWS RIGHT  Result Date: 12/28/2019 CLINICAL DATA:  ORIF EXAM: RIGHT FEMUR 2 VIEWS COMPARISON:  12/28/2019 FINDINGS: Seven low  resolution intraoperative spot views of the right femur. Total fluoroscopy time was 44 seconds. Incompletely visualized femoral portion of right hip arthroplasty. Lateral plate and fixating screws within the mid to distal femur across distal femoral fracture with anatomic alignment. IMPRESSION: Intraoperative fluoroscopic assistance provided during surgical fixation of distal femoral fracture Electronically Signed   By: Donavan Foil M.D.   On: 12/28/2019 21:20    Subjective: Pain controlled.  No BM   Discharge Exam: Vitals:   12/30/19 2115 12/31/19 0437  BP: (!) 142/78 130/71  Pulse: 91 76  Resp:    Temp: 98.6 F (37 C) 98.4 F (36.9 C)  SpO2: 96% 96%     General: Pt is alert, awake, not in acute distress Cardiovascular: RRR, S1/S2 +, no rubs, no gallops Respiratory: CTA bilaterally, no wheezing,  no rhonchi Abdominal: Soft, NT, ND, bowel sounds + Extremities: no edema, no cyanosis    The results of significant diagnostics from this hospitalization (including imaging, microbiology, ancillary and laboratory) are listed below for reference.     Microbiology: Recent Results (from the past 240 hour(s))  Respiratory Panel by RT PCR (Flu A&B, Covid) - Nasopharyngeal Swab     Status: Abnormal   Collection Time: 12/28/19 12:54 PM   Specimen: Nasopharyngeal Swab  Result Value Ref Range Status   SARS Coronavirus 2 by RT PCR POSITIVE (A) NEGATIVE Final    Comment: RESULT CALLED TO, READ BACK BY AND VERIFIED WITH: S.WEST,RN [id number] _0  BY V.WILKINS (NOTE) SARS-CoV-2 target nucleic acids are DETECTED. SARS-CoV-2 RNA is generally detectable in upper respiratory specimens  during the acute phase of infection. Positive results are indicative of the presence of the identified virus, but do not rule out bacterial infection or co-infection with other pathogens not detected by the test. Clinical correlation with patient history and other diagnostic information is necessary to determine patient infection status. The expected result is Negative. Fact Sheet for Patients:  PinkCheek.be Fact Sheet for Healthcare Providers: GravelBags.it This test is not yet approved or cleared by the [place] FDA and  has been authorized for detection and/or diagnosis of SARS-CoV-2 by FDA under an Emergency Use Authorization (EUA).  This EUA will remain in effect (meaning this test can be used) f or the duration of  the COVID-19 declaration under Section 564(b)(1) of the Act, 21 U.S.C. section 360bbb-3(b)(1), unless the authorization is terminated or revoked sooner.    Influenza A by PCR NEGATIVE NEGATIVE Final   Influenza B by PCR NEGATIVE NEGATIVE Final    Comment: (NOTE) The Xpert Xpress SARS-CoV-2/FLU/RSV assay is intended as an aid in  the  diagnosis of influenza from Nasopharyngeal swab specimens and  should not be used as a sole basis for treatment. Nasal washings and  aspirates are unacceptable for Xpert Xpress SARS-CoV-2/FLU/RSV  testing. Fact Sheet for Patients: PinkCheek.be Fact Sheet for Healthcare Providers: GravelBags.it This test is not yet approved or cleared by the [place] FDA and  has been authorized for detection and/or diagnosis of SARS-CoV-2 by  FDA under an Emergency Use Authorization (EUA). This EUA will remain  in effect (meaning this test can be used) for the duration of the  Covid-19 declaration under Section 564(b)(1) of the Act, 21  U.S.C. section 360bbb-3(b)(1), unless the authorization is  terminated or revoked. Performed at [facility], [place] [address], [place], [place] [address]      Labs: BNP (last 3 results) No results for input(s): BNP in the last 8760 hours. Basic Metabolic Panel:  Recent Labs  Lab 12/28/19 1124 12/29/19 0311 12/30/19 0217 12/31/19 0236  NA 141 135 137 133*  K 3.5 3.3* 3.1* 4.1  CL 103 100 100 99  CO2 _0 GLUCOSE 140* 195* 130* 141*  BUN _1 CREATININE 0.67 0.52 0.71 0.44  CALCIUM 9.2 8.2* 8.4* 8.4*  MG  --   --   --  2.0   Liver Function Tests: Recent Labs  Lab 12/28/19 1124  AST 27  ALT 18  ALKPHOS 80  BILITOT 0.6  PROT 7.3  ALBUMIN 4.1   No results for input(s): LIPASE, AMYLASE in the last 168 hours. No results for input(s): AMMONIA in the last 168 hours. CBC: Recent Labs  Lab 12/28/19 1124 12/29/19 0311 12/30/19 0217 12/31/19 0236  WBC 12.2* 10.1 7.6 8.0  NEUTROABS 10.6*  --   --   --   HGB 14.4 11.7* 9.9* 10.5*  HCT 42.5 34.5* 29.8* 31.2*  MCV 84.8 85.8 86.4 86.4  PLT 224 174 159 175   Cardiac Enzymes: No results for input(s): CKTOTAL, CKMB, CKMBINDEX, TROPONINI in the last 168 hours. BNP: Invalid input(s): POCBNP CBG: No results  for input(s): GLUCAP in the last 168 hours. D-Dimer No results for input(s): DDIMER in the last 72 hours. Hgb A1c No results for input(s): HGBA1C in the last 72 hours. Lipid Profile No results for input(s): CHOL, HDL, LDLCALC, TRIG, CHOLHDL, LDLDIRECT in the last 72 hours. Thyroid function studies No results for input(s): TSH, T4TOTAL, T3FREE, THYROIDAB in the last 72 hours.  Invalid input(s): FREET3 Anemia work up No results for input(s): VITAMINB12, FOLATE, FERRITIN, TIBC, IRON, RETICCTPCT in the last 72 hours. Urinalysis    Component Value Date/Time   COLORURINE YELLOW 07/13/2017 1419   APPEARANCEUR HAZY (A) 07/13/2017 1419   LABSPEC 1.018 07/13/2017 1419   PHURINE 5.0 07/13/2017 1419   GLUCOSEU NEGATIVE 07/13/2017 1419   HGBUR NEGATIVE 07/13/2017 1419   BILIRUBINUR NEGATIVE 07/13/2017 1419   KETONESUR 5 (A) 07/13/2017 1419   PROTEINUR NEGATIVE 07/13/2017 1419   UROBILINOGEN 1.0 11/04/2014 1617   NITRITE NEGATIVE 07/13/2017 1419   LEUKOCYTESUR LARGE (A) 07/13/2017 1419   Sepsis Labs Invalid input(s): PROCALCITONIN,  WBC,  LACTICIDVEN Microbiology Recent Results (from the past 240 hour(s))  Respiratory Panel by RT PCR (Flu A&B, Covid) - Nasopharyngeal Swab     Status: Abnormal   Collection Time: 12/28/19 12:54 PM   Specimen: Nasopharyngeal Swab  Result Value Ref Range Status   SARS Coronavirus 2 by RT PCR POSITIVE (A) NEGATIVE Final    Comment: RESULT CALLED TO, READ BACK BY AND VERIFIED WITH: S.WEST,RN [id number] _2  BY V.WILKINS (NOTE) SARS-CoV-2 target nucleic acids are DETECTED. SARS-CoV-2 RNA is generally detectable in upper respiratory specimens  during the acute phase of infection. Positive results are indicative of the presence of the identified virus, but do not rule out bacterial infection or co-infection with other pathogens not detected by the test. Clinical correlation with patient history and other diagnostic information is necessary to determine  patient infection status. The expected result is Negative. Fact Sheet for Patients:  PinkCheek.be Fact Sheet for Healthcare Providers: GravelBags.it This test is not yet approved or cleared by the [place] FDA and  has been authorized for detection and/or diagnosis of SARS-CoV-2 by FDA under an Emergency Use Authorization (EUA).  This EUA will remain in effect (meaning this test can be used) f or the duration of  the COVID-19 declaration under Section 564(b)(1) of  the Act, 21 U.S.C. section 360bbb-3(b)(1), unless the authorization is terminated or revoked sooner.    Influenza A by PCR NEGATIVE NEGATIVE Final   Influenza B by PCR NEGATIVE NEGATIVE Final    Comment: (NOTE) The Xpert Xpress SARS-CoV-2/FLU/RSV assay is intended as an aid in  the diagnosis of influenza from Nasopharyngeal swab specimens and  should not be used as a sole basis for treatment. Nasal washings and  aspirates are unacceptable for Xpert Xpress SARS-CoV-2/FLU/RSV  testing. Fact Sheet for Patients: PinkCheek.be Fact Sheet for Healthcare Providers: GravelBags.it This test is not yet approved or cleared by the [place] FDA and  has been authorized for detection and/or diagnosis of SARS-CoV-2 by  FDA under an Emergency Use Authorization (EUA). This EUA will remain  in effect (meaning this test can be used) for the duration of the  Covid-19 declaration under Section 564(b)(1) of the Act, 21  U.S.C. section 360bbb-3(b)(1), unless the authorization is  terminated or revoked. Performed at [facility], [place] [address], [place], [place] [address]      Time coordinating discharge: 40 minutes  SIGNED:   Elmarie Shiley, MD  [facility]

## 2019-12-31 NOTE — TOC Progression Note (Signed)
Transition of Care [facility]) - Progression Note    Patient Details  Name: MARELLY WEHRMAN MRN: [id number] Date of Birth: [birth year]  [facility] CM/SW Contact  Geni Bers, RN Phone Number: 12/31/2019, 1:17 PM  Clinical Narrative:     Pt accepted [facility] in [place]. Waiting for authorization from insurance.   Expected Discharge Plan: Home w Home Health Services Barriers to Discharge: No Barriers Identified  Expected Discharge Plan and Services Expected Discharge Plan: Home w Home Health Services   Discharge Planning Services: CM Consult   Living arrangements for the past 2 months: Single Family Home Expected Discharge Date: 12/31/19                                     Social Determinants of Health (SDOH) Interventions    Readmission Risk Interventions No flowsheet data found.

## 2019-12-31 NOTE — Progress Notes (Signed)
Physical Therapy Treatment Patient Details Name: Janet Mcdonald MRN: [id number] DOB: [birth year] Today's Date: 12/31/2019    History of Present Illness 74 year old with past medical history significant for hypertension who presents to the ED after mechanical fall at home.  Patient was walking down her mother's driveway when she slipped on a patch of ice with hyperflexion of her right lower extremity underneath her body.  She was unable to bear weight following the events with significant pain.  Evaluation in the ED right knee x-ray showed fracture of the right femur distal shaft.  Chest x-ray was negative for acute process.  Orthopedic was consulted and patient underwent open treatment of right distal femur fracture with internal fixation on 12/28/2019. Of note patient has persistent COVID-19 viral test positive.    PT Comments    Pt demonstrated improved bed mobility and sit/supine with min A for R LE.  She required increased cues for technique and mod A of 2 for sit to stands.  Pt unable to pivot today, limited by UE weakness to assist in maintaining NWB on R LE.  Pt expressing discouragement/worry over her weaknesses.    Follow Up Recommendations  SNF     Equipment Recommendations  Other (comment)(defer to next venue)    Recommendations for Other Services       Precautions / Restrictions Precautions Precautions: Fall Required Braces or Orthoses: Knee Immobilizer - Right Knee Immobilizer - Right: On at all times Restrictions Weight Bearing Restrictions: Yes RLE Weight Bearing: Non weight bearing    Mobility  Bed Mobility Overal bed mobility: Needs Assistance Bed Mobility: Supine to Sit;Sit to Supine     Supine to sit: Min assist Sit to supine: Min assist   General bed mobility comments: cues for technique and assist with R LE  Transfers Overall transfer level: Needs assistance Equipment used: Rolling walker (2 wheeled) Transfers: Sit to/from Stand Sit to Stand: Mod  assist;+2 physical assistance;From elevated surface         General transfer comment: Performed sit to stand x 2 (and 2 attempts); required cues to lean forward, for hand placement, use of momentum, and to push up on L LE  Ambulation/Gait             General Gait Details: unable to pivot or hop   Stairs             Wheelchair Mobility    Modified Rankin (Stroke Patients Only)       Balance Overall balance assessment: Needs assistance Sitting-balance support: No upper extremity supported;Feet supported Sitting balance-Leahy Scale: Good     Standing balance support: Bilateral upper extremity supported;During functional activity Standing balance-Leahy Scale: Poor                              Cognition Arousal/Alertness: Awake/alert Behavior During Therapy: WFL for tasks assessed/performed Overall Cognitive Status: Within Functional Limits for tasks assessed                                        Exercises      General Comments General comments (skin integrity, edema, etc.): Pt expressing general concern over how she is going to manage due to NWB status, 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  Encouraged/educated pt on transfer techniques, giving time for recovery, and that  she would get more therapy at SNF to progress .  RN reported pt to be discharging within the hour so pt was returned to bed.      Pertinent Vitals/Pain Pain Assessment: 0-10 Pain Score: 1  Pain Location: R leg with mobility only Pain Descriptors / Indicators: Discomfort;Sore Pain Intervention(s): Limited activity within patient's tolerance;Monitored during session;Ice applied    Home Living                      Prior Function            PT Goals (current goals can now be found in the care plan section) Progress towards PT goals: Progressing toward goals    Frequency    Min 3X/week      PT Plan Current  plan remains appropriate    Co-evaluation              AM-PAC PT "6 Clicks" Mobility   Outcome Measure    Help needed moving from lying on your back to sitting on the side of a flat bed without using bedrails?: A Little Help needed moving to and from a bed to a chair (including a wheelchair)?: A Lot Help needed standing up from a chair using your arms (e.g., wheelchair or bedside chair)?: A Lot Help needed to walk in hospital room?: Total Help needed climbing 3-5 steps with a railing? : Total 6 Click Score: 9    End of Session Equipment Utilized During Treatment: Gait belt Activity Tolerance: Patient tolerated treatment well Patient left: in bed;with call bell/phone within reach;with bed alarm set Nurse Communication: Mobility status PT Visit Diagnosis: Other abnormalities of gait and mobility (R26.89);Pain Pain - Right/Left: Right Pain - part of body: Leg     Time: 4010-2725 PT Time Calculation (min) (ACUTE ONLY): 19 min  Charges:  $Therapeutic Activity: 8-22 mins                     Maggie Font, PT Acute Rehab Services Pager [phone] [facility] [phone] [facility] [phone]    Karlton Lemon 12/31/2019, 2:13 PM

## 2019-12-31 NOTE — Care Management Important Message (Signed)
Important Message  Patient Details IM Letter given to Ezekiel Ina RN Case Manager to present to the Patient Name: Janet Mcdonald MRN: [id number] Date of Birth: 08/23/1946   Medicare Important Message Given:  Yes     Caren Macadam 12/31/2019, 10:35 AM

## 2020-01-03 ENCOUNTER — Encounter: Payer: Self-pay | Admitting: *Deleted

## 2020-01-03 DIAGNOSIS — S7291XD Unspecified fracture of right femur, subsequent encounter for closed fracture with routine healing: Secondary | ICD-10-CM | POA: Diagnosis not present

## 2020-01-03 DIAGNOSIS — U071 COVID-19: Secondary | ICD-10-CM | POA: Diagnosis not present

## 2020-01-03 DIAGNOSIS — I10 Essential (primary) hypertension: Secondary | ICD-10-CM | POA: Diagnosis not present

## 2020-01-05 DIAGNOSIS — U071 COVID-19: Secondary | ICD-10-CM | POA: Diagnosis not present

## 2020-01-05 DIAGNOSIS — S7291XD Unspecified fracture of right femur, subsequent encounter for closed fracture with routine healing: Secondary | ICD-10-CM | POA: Diagnosis not present

## 2020-01-05 DIAGNOSIS — I10 Essential (primary) hypertension: Secondary | ICD-10-CM | POA: Diagnosis not present

## 2020-01-10 DIAGNOSIS — S7291XD Unspecified fracture of right femur, subsequent encounter for closed fracture with routine healing: Secondary | ICD-10-CM | POA: Diagnosis not present

## 2020-01-10 DIAGNOSIS — U071 COVID-19: Secondary | ICD-10-CM | POA: Diagnosis not present

## 2020-01-10 DIAGNOSIS — I10 Essential (primary) hypertension: Secondary | ICD-10-CM | POA: Diagnosis not present

## 2020-01-17 DIAGNOSIS — I10 Essential (primary) hypertension: Secondary | ICD-10-CM | POA: Diagnosis not present

## 2020-01-17 DIAGNOSIS — S7291XD Unspecified fracture of right femur, subsequent encounter for closed fracture with routine healing: Secondary | ICD-10-CM | POA: Diagnosis not present

## 2020-01-17 DIAGNOSIS — U071 COVID-19: Secondary | ICD-10-CM | POA: Diagnosis not present

## 2020-01-18 DIAGNOSIS — Z4889 Encounter for other specified surgical aftercare: Secondary | ICD-10-CM | POA: Diagnosis not present

## 2020-01-18 DIAGNOSIS — M25561 Pain in right knee: Secondary | ICD-10-CM | POA: Diagnosis not present

## 2020-01-18 DIAGNOSIS — S72401D Unspecified fracture of lower end of right femur, subsequent encounter for closed fracture with routine healing: Secondary | ICD-10-CM | POA: Diagnosis not present

## 2020-01-27 DIAGNOSIS — I10 Essential (primary) hypertension: Secondary | ICD-10-CM | POA: Diagnosis not present

## 2020-01-27 DIAGNOSIS — D649 Anemia, unspecified: Secondary | ICD-10-CM | POA: Diagnosis not present

## 2020-01-27 DIAGNOSIS — Z8781 Personal history of (healed) traumatic fracture: Secondary | ICD-10-CM | POA: Diagnosis not present

## 2020-02-11 DIAGNOSIS — S72401D Unspecified fracture of lower end of right femur, subsequent encounter for closed fracture with routine healing: Secondary | ICD-10-CM | POA: Diagnosis not present

## 2020-02-11 DIAGNOSIS — Z4889 Encounter for other specified surgical aftercare: Secondary | ICD-10-CM | POA: Diagnosis not present

## 2020-02-11 DIAGNOSIS — M25561 Pain in right knee: Secondary | ICD-10-CM | POA: Diagnosis not present

## 2020-02-18 DIAGNOSIS — D649 Anemia, unspecified: Secondary | ICD-10-CM | POA: Diagnosis not present

## 2020-02-18 DIAGNOSIS — I10 Essential (primary) hypertension: Secondary | ICD-10-CM | POA: Diagnosis not present

## 2020-03-10 DIAGNOSIS — M25561 Pain in right knee: Secondary | ICD-10-CM | POA: Diagnosis not present

## 2020-03-10 DIAGNOSIS — Z4889 Encounter for other specified surgical aftercare: Secondary | ICD-10-CM | POA: Diagnosis not present

## 2020-03-10 DIAGNOSIS — S72401D Unspecified fracture of lower end of right femur, subsequent encounter for closed fracture with routine healing: Secondary | ICD-10-CM | POA: Diagnosis not present

## 2020-03-28 DIAGNOSIS — E876 Hypokalemia: Secondary | ICD-10-CM | POA: Diagnosis not present

## 2020-04-07 DIAGNOSIS — M25561 Pain in right knee: Secondary | ICD-10-CM | POA: Diagnosis not present

## 2020-04-07 DIAGNOSIS — S72401D Unspecified fracture of lower end of right femur, subsequent encounter for closed fracture with routine healing: Secondary | ICD-10-CM | POA: Diagnosis not present

## 2020-04-07 DIAGNOSIS — M545 Low back pain: Secondary | ICD-10-CM | POA: Diagnosis not present

## 2020-04-14 DIAGNOSIS — R69 Illness, unspecified: Secondary | ICD-10-CM | POA: Diagnosis not present

## 2020-05-12 DIAGNOSIS — S71001D Unspecified open wound, right hip, subsequent encounter: Secondary | ICD-10-CM | POA: Diagnosis not present

## 2020-05-18 DIAGNOSIS — S71001D Unspecified open wound, right hip, subsequent encounter: Secondary | ICD-10-CM | POA: Diagnosis not present

## 2020-06-01 DIAGNOSIS — S71001D Unspecified open wound, right hip, subsequent encounter: Secondary | ICD-10-CM | POA: Diagnosis not present

## 2020-06-08 DIAGNOSIS — Z780 Asymptomatic menopausal state: Secondary | ICD-10-CM | POA: Diagnosis not present

## 2020-06-08 DIAGNOSIS — Z1231 Encounter for screening mammogram for malignant neoplasm of breast: Secondary | ICD-10-CM | POA: Diagnosis not present

## 2020-06-30 DIAGNOSIS — S71001D Unspecified open wound, right hip, subsequent encounter: Secondary | ICD-10-CM | POA: Diagnosis not present

## 2020-07-04 DIAGNOSIS — S71001D Unspecified open wound, right hip, subsequent encounter: Secondary | ICD-10-CM | POA: Diagnosis not present

## 2020-07-07 DIAGNOSIS — S71001D Unspecified open wound, right hip, subsequent encounter: Secondary | ICD-10-CM | POA: Diagnosis not present

## 2020-07-13 DIAGNOSIS — S71001D Unspecified open wound, right hip, subsequent encounter: Secondary | ICD-10-CM | POA: Diagnosis not present

## 2020-07-27 DIAGNOSIS — S71001D Unspecified open wound, right hip, subsequent encounter: Secondary | ICD-10-CM | POA: Diagnosis not present

## 2020-08-04 DIAGNOSIS — R69 Illness, unspecified: Secondary | ICD-10-CM | POA: Diagnosis not present

## 2020-08-08 DIAGNOSIS — S71001D Unspecified open wound, right hip, subsequent encounter: Secondary | ICD-10-CM | POA: Diagnosis not present

## 2020-08-23 DIAGNOSIS — S71001D Unspecified open wound, right hip, subsequent encounter: Secondary | ICD-10-CM | POA: Diagnosis not present

## 2020-08-25 DIAGNOSIS — R69 Illness, unspecified: Secondary | ICD-10-CM | POA: Diagnosis not present

## 2020-08-25 DIAGNOSIS — S71001D Unspecified open wound, right hip, subsequent encounter: Secondary | ICD-10-CM | POA: Diagnosis not present

## 2020-09-04 DIAGNOSIS — S71001D Unspecified open wound, right hip, subsequent encounter: Secondary | ICD-10-CM | POA: Diagnosis not present

## 2020-09-13 DIAGNOSIS — S71001D Unspecified open wound, right hip, subsequent encounter: Secondary | ICD-10-CM | POA: Diagnosis not present

## 2020-12-07 ENCOUNTER — Telehealth: Payer: Self-pay | Admitting: *Deleted

## 2020-12-07 NOTE — Telephone Encounter (Signed)
Communication sent to patient's wife, Jomayra, to schedule a palliative care home visit. Awaiting return response.

## 2021-01-04 DIAGNOSIS — S71001D Unspecified open wound, right hip, subsequent encounter: Secondary | ICD-10-CM | POA: Diagnosis not present

## 2021-01-08 DIAGNOSIS — S71001D Unspecified open wound, right hip, subsequent encounter: Secondary | ICD-10-CM | POA: Diagnosis not present

## 2021-01-23 ENCOUNTER — Other Ambulatory Visit: Payer: Self-pay | Admitting: Orthopedic Surgery

## 2021-01-23 ENCOUNTER — Other Ambulatory Visit (HOSPITAL_COMMUNITY): Payer: Self-pay | Admitting: Orthopedic Surgery

## 2021-01-23 DIAGNOSIS — S71001D Unspecified open wound, right hip, subsequent encounter: Secondary | ICD-10-CM

## 2021-01-26 ENCOUNTER — Other Ambulatory Visit (HOSPITAL_BASED_OUTPATIENT_CLINIC_OR_DEPARTMENT_OTHER): Payer: Self-pay | Admitting: Orthopedic Surgery

## 2021-01-26 ENCOUNTER — Other Ambulatory Visit (HOSPITAL_COMMUNITY): Payer: Self-pay | Admitting: Orthopedic Surgery

## 2021-01-26 DIAGNOSIS — S71001A Unspecified open wound, right hip, initial encounter: Secondary | ICD-10-CM

## 2021-01-26 DIAGNOSIS — S71101A Unspecified open wound, right thigh, initial encounter: Secondary | ICD-10-CM

## 2021-01-29 DIAGNOSIS — Z7982 Long term (current) use of aspirin: Secondary | ICD-10-CM | POA: Diagnosis not present

## 2021-01-29 DIAGNOSIS — Z791 Long term (current) use of non-steroidal anti-inflammatories (NSAID): Secondary | ICD-10-CM | POA: Diagnosis not present

## 2021-01-29 DIAGNOSIS — M1990 Unspecified osteoarthritis, unspecified site: Secondary | ICD-10-CM | POA: Diagnosis not present

## 2021-01-29 DIAGNOSIS — Z809 Family history of malignant neoplasm, unspecified: Secondary | ICD-10-CM | POA: Diagnosis not present

## 2021-01-29 DIAGNOSIS — I10 Essential (primary) hypertension: Secondary | ICD-10-CM | POA: Diagnosis not present

## 2021-01-29 DIAGNOSIS — Z8249 Family history of ischemic heart disease and other diseases of the circulatory system: Secondary | ICD-10-CM | POA: Diagnosis not present

## 2021-01-29 DIAGNOSIS — Z008 Encounter for other general examination: Secondary | ICD-10-CM | POA: Diagnosis not present

## 2021-01-29 DIAGNOSIS — Z96649 Presence of unspecified artificial hip joint: Secondary | ICD-10-CM | POA: Diagnosis not present

## 2021-01-31 ENCOUNTER — Ambulatory Visit (HOSPITAL_COMMUNITY)
Admission: RE | Admit: 2021-01-31 | Discharge: 2021-01-31 | Disposition: A | Discharge status: Home / Self Care | Payer: Medicare HMO | Source: Ambulatory Visit | Attending: Orthopedic Surgery | Admitting: Orthopedic Surgery

## 2021-01-31 ENCOUNTER — Other Ambulatory Visit: Payer: Self-pay

## 2021-01-31 ENCOUNTER — Other Ambulatory Visit (HOSPITAL_COMMUNITY): Payer: Self-pay | Admitting: Orthopedic Surgery

## 2021-01-31 ENCOUNTER — Ambulatory Visit (HOSPITAL_COMMUNITY): Payer: Medicare HMO

## 2021-01-31 DIAGNOSIS — S71001D Unspecified open wound, right hip, subsequent encounter: Secondary | ICD-10-CM

## 2021-01-31 MED ORDER — TECHNETIUM TC 99M MEDRONATE IV KIT
21.8000 | PACK | Freq: Once | INTRAVENOUS | Status: AC | PRN
  Administered 2021-01-31: 21.8 via INTRAVENOUS

## 2021-02-02 ENCOUNTER — Other Ambulatory Visit: Payer: Self-pay

## 2021-02-02 ENCOUNTER — Ambulatory Visit (HOSPITAL_BASED_OUTPATIENT_CLINIC_OR_DEPARTMENT_OTHER)
Admission: RE | Admit: 2021-02-02 | Discharge: 2021-02-02 | Disposition: A | Discharge status: Home / Self Care | Payer: Medicare HMO | Source: Ambulatory Visit | Attending: Orthopedic Surgery | Admitting: Orthopedic Surgery

## 2021-02-02 DIAGNOSIS — S71101A Unspecified open wound, right thigh, initial encounter: Secondary | ICD-10-CM | POA: Diagnosis not present

## 2021-02-02 DIAGNOSIS — S72401A Unspecified fracture of lower end of right femur, initial encounter for closed fracture: Secondary | ICD-10-CM | POA: Diagnosis not present

## 2021-02-02 DIAGNOSIS — S71001A Unspecified open wound, right hip, initial encounter: Secondary | ICD-10-CM | POA: Diagnosis not present

## 2021-02-07 ENCOUNTER — Encounter (HOSPITAL_COMMUNITY): Payer: Self-pay

## 2021-02-07 ENCOUNTER — Encounter (HOSPITAL_COMMUNITY)
Admission: RE | Admit: 2021-02-07 | Discharge: 2021-02-07 | Disposition: A | Discharge status: Home / Self Care | Payer: Medicare HMO | Source: Ambulatory Visit | Attending: Orthopedic Surgery | Admitting: Orthopedic Surgery

## 2021-02-07 ENCOUNTER — Encounter (HOSPITAL_COMMUNITY): Payer: Medicare HMO

## 2021-02-07 ENCOUNTER — Other Ambulatory Visit: Payer: Self-pay

## 2021-02-07 ENCOUNTER — Ambulatory Visit (HOSPITAL_COMMUNITY): Payer: Medicare HMO

## 2021-02-07 DIAGNOSIS — Z96643 Presence of artificial hip joint, bilateral: Secondary | ICD-10-CM | POA: Diagnosis not present

## 2021-02-07 DIAGNOSIS — S71001D Unspecified open wound, right hip, subsequent encounter: Secondary | ICD-10-CM | POA: Diagnosis not present

## 2021-02-07 DIAGNOSIS — S71001A Unspecified open wound, right hip, initial encounter: Secondary | ICD-10-CM | POA: Diagnosis not present

## 2021-02-07 MED ORDER — TECHNETIUM TC 99M MEDRONATE IV KIT
20.0000 | PACK | Freq: Once | INTRAVENOUS | Status: AC | PRN
  Administered 2021-02-07: 20.7 via INTRAVENOUS

## 2021-02-13 DIAGNOSIS — Z1231 Encounter for screening mammogram for malignant neoplasm of breast: Secondary | ICD-10-CM | POA: Diagnosis not present

## 2021-02-13 DIAGNOSIS — Z13220 Encounter for screening for lipoid disorders: Secondary | ICD-10-CM | POA: Diagnosis not present

## 2021-02-13 DIAGNOSIS — I10 Essential (primary) hypertension: Secondary | ICD-10-CM | POA: Diagnosis not present

## 2021-02-13 DIAGNOSIS — Z23 Encounter for immunization: Secondary | ICD-10-CM | POA: Diagnosis not present

## 2021-02-13 DIAGNOSIS — Z0000 Encounter for general adult medical examination without abnormal findings: Secondary | ICD-10-CM | POA: Diagnosis not present

## 2021-02-13 DIAGNOSIS — Z1211 Encounter for screening for malignant neoplasm of colon: Secondary | ICD-10-CM | POA: Diagnosis not present

## 2021-02-13 DIAGNOSIS — T148XXA Other injury of unspecified body region, initial encounter: Secondary | ICD-10-CM | POA: Diagnosis not present

## 2021-02-13 DIAGNOSIS — Z7185 Encounter for immunization safety counseling: Secondary | ICD-10-CM | POA: Diagnosis not present

## 2021-02-13 DIAGNOSIS — R7303 Prediabetes: Secondary | ICD-10-CM | POA: Diagnosis not present

## 2021-02-14 DIAGNOSIS — S71001D Unspecified open wound, right hip, subsequent encounter: Secondary | ICD-10-CM | POA: Diagnosis not present

## 2021-02-15 DIAGNOSIS — Z13220 Encounter for screening for lipoid disorders: Secondary | ICD-10-CM | POA: Diagnosis not present

## 2021-02-15 DIAGNOSIS — Z136 Encounter for screening for cardiovascular disorders: Secondary | ICD-10-CM | POA: Diagnosis not present

## 2021-02-15 DIAGNOSIS — I10 Essential (primary) hypertension: Secondary | ICD-10-CM | POA: Diagnosis not present

## 2021-03-22 DIAGNOSIS — E876 Hypokalemia: Secondary | ICD-10-CM | POA: Diagnosis not present

## 2021-03-27 DIAGNOSIS — S71001D Unspecified open wound, right hip, subsequent encounter: Secondary | ICD-10-CM | POA: Diagnosis not present

## 2021-04-26 DIAGNOSIS — E876 Hypokalemia: Secondary | ICD-10-CM | POA: Diagnosis not present

## 2021-06-13 DIAGNOSIS — Z1231 Encounter for screening mammogram for malignant neoplasm of breast: Secondary | ICD-10-CM | POA: Diagnosis not present

## 2021-07-10 IMAGING — CT CT FEMUR *R* W/O CM
1 series · 12 of 14 positions shown, 15 images · non-contrast
Comparison: Intraoperative radiographs 12/28/2019.  CT 04/10/2017

CLINICAL DATA: Chronic draining soft tissue wound. Spider bite in
8385 with subsequent infection.

EXAM:
CT OF THE LOWER RIGHT EXTREMITY WITHOUT CONTRAST
TECHNIQUE: Multidetector CT imaging of the right thigh was performed according
to the standard protocol.

[Series 5: axial soft · axial · 0.45mm/px · z∈[+382,+880]mm · 12 of 295 slices shown, 15 images]
[im 23/295  soft-tissue]
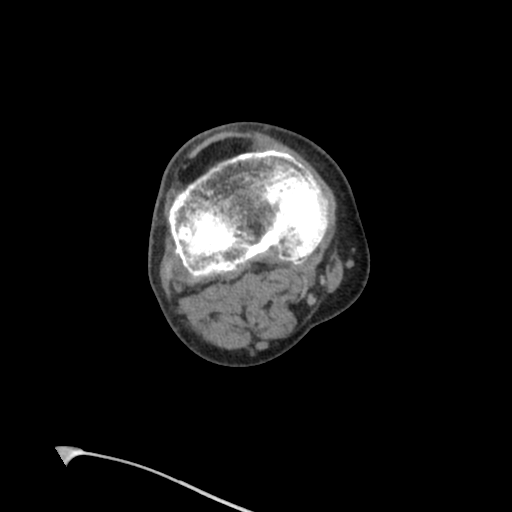
[im 23/295  bone]
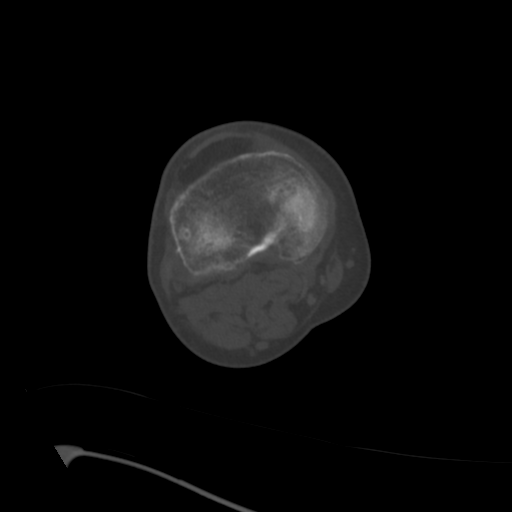
[im 46/295  bone]
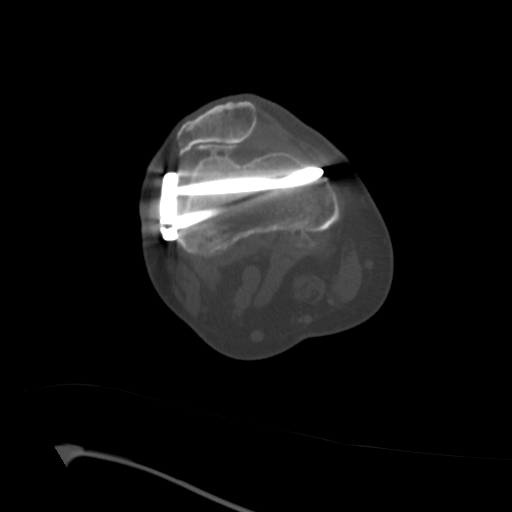
[im 68/295  bone]
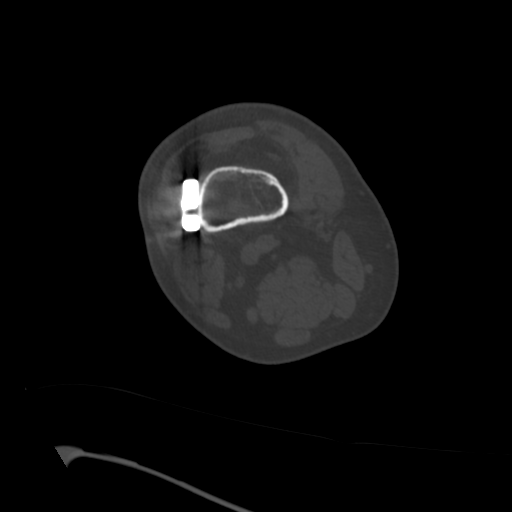
[im 91/295  bone]
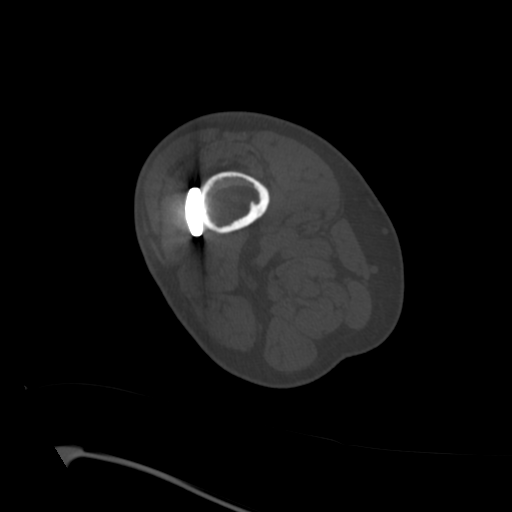
[im 114/295  soft-tissue]
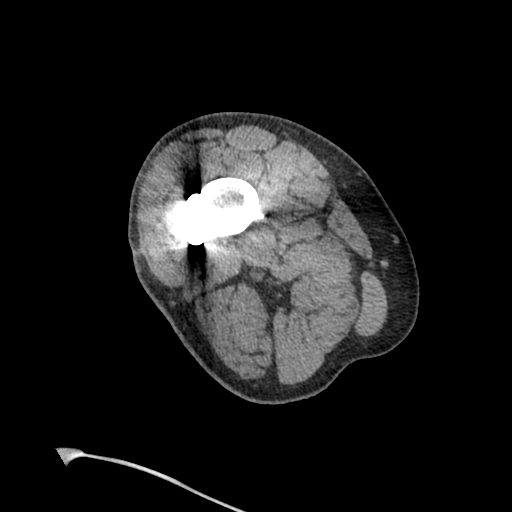
[im 114/295  bone]
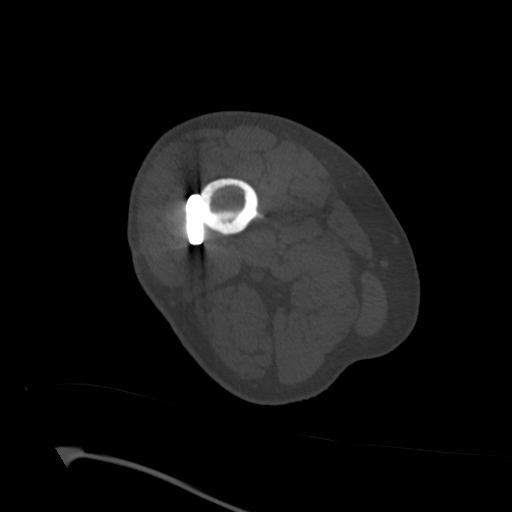
[im 136/295  bone]
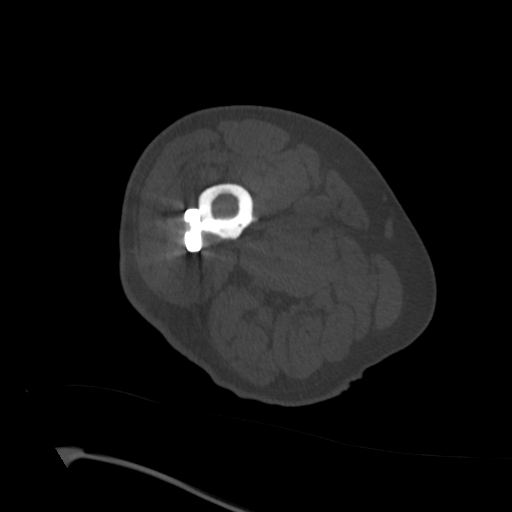
[im 159/295  bone]
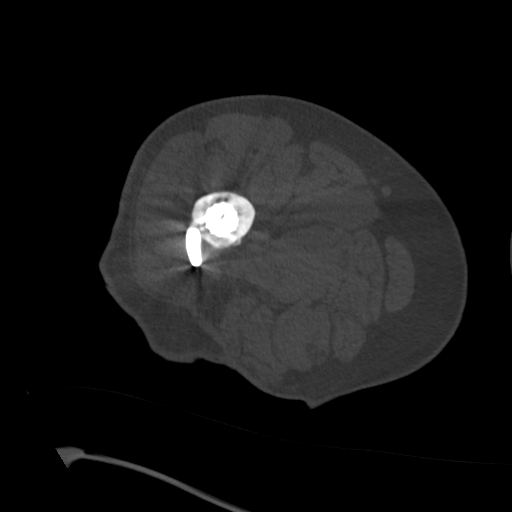
[im 181/295  bone]
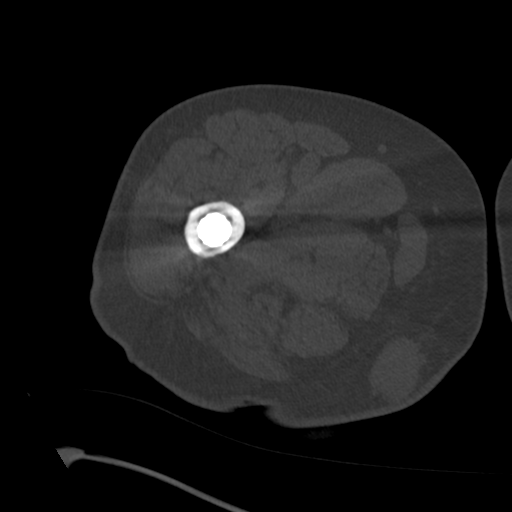
[im 204/295  soft-tissue]
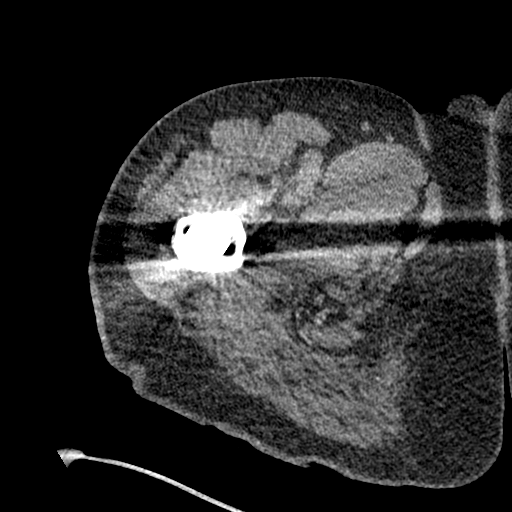
[im 204/295  bone]
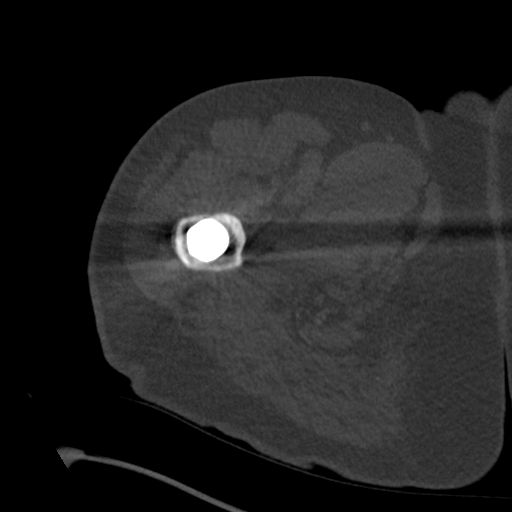
[im 227/295  bone]
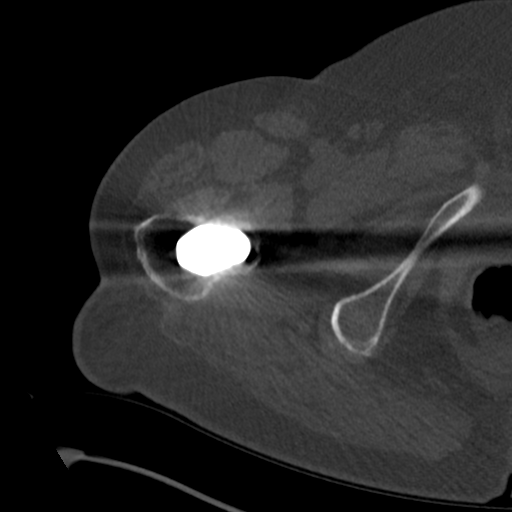
[im 249/295  bone]
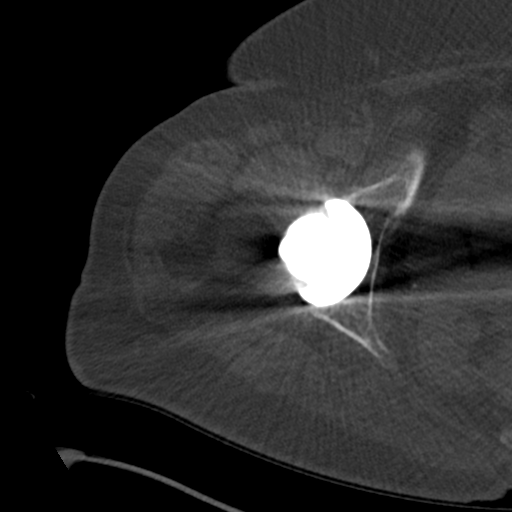
[im 272/295  bone]
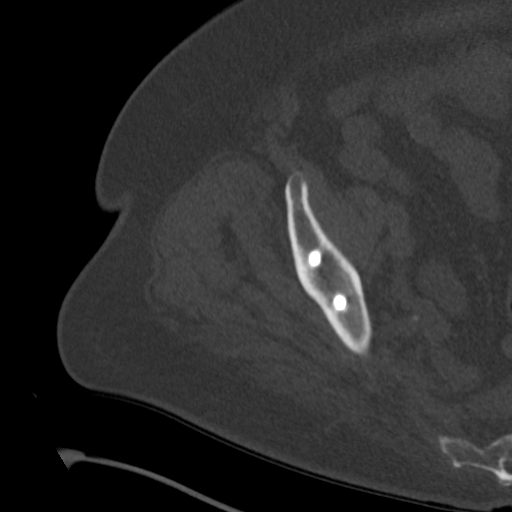

[12 of 14 positions shown; findings below may reference images not displayed]

FINDINGS: Bones/Joint/Cartilage

Images extend from the lower pelvis through the proximal right lower
leg. Patient is status post remote right total hip arthroplasty. The
hardware is intact without loosening. Patient has undergone lateral
plate and screw fixation of the distal right femur since the
previous CT. The underlying oblique fracture of the distal femoral
diaphysis has healed with minimal posttraumatic deformity. There is
no evidence of acute fracture, dislocation or bone destruction.
Underlying tricompartmental degenerative changes are present at the
knee with the lateral meniscal chondrocalcinosis. No significant
knee or hip joint effusion.

Ligaments

Suboptimally assessed by CT.

Muscles and Tendons

No focal muscular atrophy or intramuscular fluid collection
identified.

Soft tissues

There is a deep soft tissue defect posterolaterally in the mid right
thigh corresponding with the patient's wound. This is marked with an
overlying BB. There is associated dermal and fascial thickening in
this area, but no residual fluid collection. There is no foreign
body or soft tissue emphysema. This wound is located near the distal
end of the femoral stem of the hip prosthesis. There is a soft
tissue mass in the posteromedial subcutaneous fat of the proximal
right thigh which measures 3.2 x 2.2 cm on image 117/5. This is
removed from the lateral soft tissue wound and unchanged from the
prior CT of nearly 4 years ago. Location is atypical for a lymph
node.
IMPRESSION: 1. Chronic soft tissue wound posterolaterally in the mid right thigh
without underlying fluid collection.
2. No evidence of osteomyelitis.
3. Interval distal femoral ORIF with healing of the previously
demonstrated distal femoral fracture. The right total hip
arthroplasty appears unchanged.
4. Stable indeterminate subcutaneous mass posteromedially in the
proximal right thigh from 8385. This is an atypical location for a
lymph node and may reflect a benign neoplasm or atypical sebaceous
cyst.

## 2021-08-16 DIAGNOSIS — M170 Bilateral primary osteoarthritis of knee: Secondary | ICD-10-CM | POA: Diagnosis not present

## 2021-08-16 DIAGNOSIS — I10 Essential (primary) hypertension: Secondary | ICD-10-CM | POA: Diagnosis not present

## 2021-08-16 DIAGNOSIS — Z23 Encounter for immunization: Secondary | ICD-10-CM | POA: Diagnosis not present

## 2021-08-16 DIAGNOSIS — Z1211 Encounter for screening for malignant neoplasm of colon: Secondary | ICD-10-CM | POA: Diagnosis not present

## 2021-08-16 DIAGNOSIS — E876 Hypokalemia: Secondary | ICD-10-CM | POA: Diagnosis not present

## 2021-08-16 DIAGNOSIS — R7303 Prediabetes: Secondary | ICD-10-CM | POA: Diagnosis not present

## 2021-08-23 DIAGNOSIS — S71001D Unspecified open wound, right hip, subsequent encounter: Secondary | ICD-10-CM | POA: Diagnosis not present

## 2021-11-16 DIAGNOSIS — S71001D Unspecified open wound, right hip, subsequent encounter: Secondary | ICD-10-CM | POA: Diagnosis not present

## 2021-11-21 DIAGNOSIS — S71001D Unspecified open wound, right hip, subsequent encounter: Secondary | ICD-10-CM | POA: Diagnosis not present

## 2022-02-07 DIAGNOSIS — S71001D Unspecified open wound, right hip, subsequent encounter: Secondary | ICD-10-CM | POA: Diagnosis not present

## 2022-04-22 DIAGNOSIS — M25512 Pain in left shoulder: Secondary | ICD-10-CM | POA: Diagnosis not present

## 2022-06-13 DIAGNOSIS — R7303 Prediabetes: Secondary | ICD-10-CM | POA: Diagnosis not present

## 2022-06-13 DIAGNOSIS — R011 Cardiac murmur, unspecified: Secondary | ICD-10-CM | POA: Diagnosis not present

## 2022-06-13 DIAGNOSIS — Z1159 Encounter for screening for other viral diseases: Secondary | ICD-10-CM | POA: Diagnosis not present

## 2022-06-13 DIAGNOSIS — E782 Mixed hyperlipidemia: Secondary | ICD-10-CM | POA: Diagnosis not present

## 2022-06-13 DIAGNOSIS — Z0000 Encounter for general adult medical examination without abnormal findings: Secondary | ICD-10-CM | POA: Diagnosis not present

## 2022-06-13 DIAGNOSIS — Z23 Encounter for immunization: Secondary | ICD-10-CM | POA: Diagnosis not present

## 2022-06-13 DIAGNOSIS — I10 Essential (primary) hypertension: Secondary | ICD-10-CM | POA: Diagnosis not present

## 2022-06-13 DIAGNOSIS — R0989 Other specified symptoms and signs involving the circulatory and respiratory systems: Secondary | ICD-10-CM | POA: Diagnosis not present

## 2022-06-25 DIAGNOSIS — Z1231 Encounter for screening mammogram for malignant neoplasm of breast: Secondary | ICD-10-CM | POA: Diagnosis not present

## 2022-07-05 DIAGNOSIS — I6523 Occlusion and stenosis of bilateral carotid arteries: Secondary | ICD-10-CM | POA: Diagnosis not present

## 2022-07-05 DIAGNOSIS — R011 Cardiac murmur, unspecified: Secondary | ICD-10-CM | POA: Diagnosis not present

## 2022-07-05 DIAGNOSIS — R0989 Other specified symptoms and signs involving the circulatory and respiratory systems: Secondary | ICD-10-CM | POA: Diagnosis not present

## 2022-08-08 DIAGNOSIS — L03032 Cellulitis of left toe: Secondary | ICD-10-CM | POA: Diagnosis not present

## 2022-09-10 DIAGNOSIS — S71001D Unspecified open wound, right hip, subsequent encounter: Secondary | ICD-10-CM | POA: Diagnosis not present

## 2022-12-12 DIAGNOSIS — I10 Essential (primary) hypertension: Secondary | ICD-10-CM | POA: Diagnosis not present

## 2022-12-12 DIAGNOSIS — R7303 Prediabetes: Secondary | ICD-10-CM | POA: Diagnosis not present

## 2022-12-12 DIAGNOSIS — I6523 Occlusion and stenosis of bilateral carotid arteries: Secondary | ICD-10-CM | POA: Diagnosis not present

## 2022-12-12 DIAGNOSIS — E782 Mixed hyperlipidemia: Secondary | ICD-10-CM | POA: Diagnosis not present

## 2023-01-14 DIAGNOSIS — S71001D Unspecified open wound, right hip, subsequent encounter: Secondary | ICD-10-CM | POA: Diagnosis not present

## 2023-01-24 DIAGNOSIS — S71001D Unspecified open wound, right hip, subsequent encounter: Secondary | ICD-10-CM | POA: Diagnosis not present

## 2023-01-24 DIAGNOSIS — Z96641 Presence of right artificial hip joint: Secondary | ICD-10-CM | POA: Diagnosis not present

## 2023-03-04 DIAGNOSIS — M542 Cervicalgia: Secondary | ICD-10-CM | POA: Diagnosis not present

## 2023-03-06 DIAGNOSIS — L089 Local infection of the skin and subcutaneous tissue, unspecified: Secondary | ICD-10-CM | POA: Diagnosis not present

## 2023-03-06 DIAGNOSIS — Z96641 Presence of right artificial hip joint: Secondary | ICD-10-CM | POA: Diagnosis not present

## 2023-03-12 ENCOUNTER — Ambulatory Visit: Payer: Medicare HMO | Admitting: Internal Medicine

## 2023-03-20 ENCOUNTER — Encounter: Payer: Self-pay | Admitting: Internal Medicine

## 2023-03-20 ENCOUNTER — Ambulatory Visit: Payer: Medicare HMO | Admitting: Internal Medicine

## 2023-03-20 ENCOUNTER — Other Ambulatory Visit: Payer: Self-pay

## 2023-03-20 VITALS — BP 190/90 | HR 69 | Temp 97.9°F | Ht 64.0 in | Wt 140.0 lb

## 2023-03-20 DIAGNOSIS — S81801A Unspecified open wound, right lower leg, initial encounter: Secondary | ICD-10-CM

## 2023-03-20 NOTE — Progress Notes (Signed)
Patient ID: Janet Mcdonald, female   DOB: 07-09-1946, 77 y.o.   MRN: [id number]  HPI Janet Mcdonald is a 77yo F with history of right hip replacement. In March 2016-- started as mrsa infection--insect bite or scratch. Then intermittently open and comes up at most for 9 to 51 days. Most recently it closed April 5th through may 10th but now re-opened and restarted on doxycycline  Initially had 2 pockets with tunneling track.   It starts with being swollen, tender then bleeding from the draining sinus but never had surgery to debride the area.  Usually augmentin to take until it closed up her wound. Abtx would dry up her wound.   Tested negative, but last culture tested positive, did take clindamycin (2 rounds in the past)   Linezolid had the most nausea  Outpatient Encounter Medications as of 03/20/2023  Medication Sig   aspirin EC 81 MG tablet Take 1 tablet (81 mg total) by mouth 2 (two) times daily.   calcium carbonate (TUMS - DOSED IN MG ELEMENTAL CALCIUM) 500 MG chewable tablet Chew 500 mg by mouth daily.    doxycycline (VIBRA-TABS) 100 MG tablet Take 1 tablet twice a day by oral route as directed for 14 days.   ferrous sulfate 325 (65 FE) MG tablet Take 1 tablet (325 mg total) by mouth 2 (two) times daily with a meal.   lisinopril-hydrochlorothiazide (PRINZIDE,ZESTORETIC) 20-25 MG per tablet Take 1 tablet by mouth every morning.   metoprolol (LOPRESSOR) 100 MG tablet Take 100 mg by mouth 2 (two) times daily.   Multiple Vitamin (MULTIVITAMIN WITH MINERALS) TABS tablet Take 1 tablet by mouth at bedtime.    potassium chloride (KLOR-CON) 10 MEQ tablet Take 10 mEq by mouth daily.   Turmeric 500 MG CAPS Take 2,000 mg by mouth daily at 12 noon.    amoxicillin-clavulanate (AUGMENTIN) 875-125 MG tablet Take 0.05 tablets by mouth once.  (Patient not taking: Reported on 03/20/2023)   senna (SENOKOT) 8.6 MG TABS tablet Take 2 tablets (17.2 mg total) by mouth 2 (two) times daily.   No  facility-administered encounter medications on file as of 03/20/2023.     Patient Active Problem List   Diagnosis Date Noted   Femur fracture, right (HCC) 12/28/2019     Health Maintenance Due  Topic Date Due   COVID-19 Vaccine (1) Never done   Hepatitis C Screening  Never done   DTaP/Tdap/Td (1 - Tdap) Never done   COLONOSCOPY (Pts 45-31yrs Insurance coverage will need to be confirmed)  Never done   MAMMOGRAM  Never done   Zoster Vaccines- Shingrix (1 of 2) Never done   Pneumonia Vaccine 67+ Years old (1 of 1 - PCV) 10/19/2013   DEXA SCAN  Never done    Sochx: widow as of this spring Review of Systems Per hpi otherwise 12 point ros is negative Physical Exam   BP (!) 165/94   Pulse 69   Temp 97.9 F (36.6 C) (Oral)   Ht 5\' 4"  (1.626 m)   Wt 140 lb (63.5 kg)   SpO2 99%   BMI 24.03 kg/m   Physical Exam  Constitutional:  oriented to person, place, and time. appears well-developed and well-nourished. No distress.  HENT: [place]/AT, PERRLA, no scleral icterus Mouth/Throat: Oropharynx is clear and moist. No oropharyngeal exudate.  Cardiovascular: Normal rate, regular rhythm and normal heart sounds. Exam reveals no gallop and no friction rub.  No murmur heard.  Pulmonary/Chest: Effort normal and breath sounds normal. No  respiratory distress.  has no wheezes.  Neck = supple, no nuchal rigidity Abdominal: Soft. Bowel sounds are normal.  exhibits no distension. There is no tenderness.  Lymphadenopathy: no cervical adenopathy. No axillary adenopathy Neurological: alert and oriented to person, place, and time.  Skin: Skin is warm and dry. No rash noted. No erythema. Posterior thigh small 1cm opening serous drainage on pad. Psychiatric: a normal mood and affect.  behavior is normal.    CBC Lab Results  Component Value Date   WBC 8.0 12/31/2019   RBC 3.61 (L) 12/31/2019   HGB 10.5 (L) 12/31/2019   HCT 31.2 (L) 12/31/2019   PLT 175 12/31/2019   MCV 86.4 12/31/2019   MCH 29.1  12/31/2019   MCHC 33.7 12/31/2019   RDW 13.1 12/31/2019   LYMPHSABS 0.9 12/28/2019   MONOABS 0.5 12/28/2019   EOSABS 0.2 12/28/2019    BMET Lab Results  Component Value Date   NA 133 (L) 12/31/2019   K 4.1 12/31/2019   CL 99 12/31/2019   CO2 26 12/31/2019   GLUCOSE 141 (H) 12/31/2019   BUN 14 12/31/2019   CREATININE 0.44 12/31/2019   CALCIUM 8.4 (L) 12/31/2019   GFRNONAA >60 12/31/2019   GFRAA >60 12/31/2019      Assessment and Plan  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 For now, I would  Continue one more week of doxycycline 100mg  po bid.  Plan to refer to plastic surgery for I x D, remove fistalous tract. Will get repeat CT of area. And labs for inflammatory marker  I have personally spent 50 minutes involved in face-to-face and non-face-to-face activities for this patient on the day of the visit. Professional time spent includes the following activities: Preparing to see the patient (review of tests), Obtaining and/or reviewing separately obtained history (admission/discharge record), Performing a medically appropriate examination and/or evaluation , Ordering medications/tests/procedures, referring and communicating with other health care professionals, Documenting clinical information in the EMR, Independently interpreting results (not separately reported), Communicating results to the patient/family/caregiver, Counseling and educating the patient/family/caregiver and Care coordination (not separately reported).

## 2023-03-21 LAB — CBC WITH DIFFERENTIAL/PLATELET
Absolute Monocytes: 421 cells/uL (ref 200–950)
Basophils Absolute: 39 cells/uL (ref 0–200)
Basophils Relative: 0.5 %
Eosinophils Absolute: 218 cells/uL (ref 15–500)
Eosinophils Relative: 2.8 %
HCT: 41.7 % (ref 35.0–45.0)
Hemoglobin: 13.8 g/dL (ref 11.7–15.5)
Lymphs Abs: 1810 cells/uL (ref 850–3900)
MCH: 28.3 pg (ref 27.0–33.0)
MCHC: 33.1 g/dL (ref 32.0–36.0)
MCV: 85.6 fL (ref 80.0–100.0)
MPV: 10.5 fL (ref 7.5–12.5)
Monocytes Relative: 5.4 %
Neutro Abs: 5312 cells/uL (ref 1500–7800)
Neutrophils Relative %: 68.1 %
Platelets: 306 10*3/uL (ref 140–400)
RBC: 4.87 10*6/uL (ref 3.80–5.10)
RDW: 13 % (ref 11.0–15.0)
Total Lymphocyte: 23.2 %
WBC: 7.8 10*3/uL (ref 3.8–10.8)

## 2023-03-21 LAB — BASIC METABOLIC PANEL
BUN: 17 mg/dL (ref 7–25)
CO2: 28 mmol/L (ref 20–32)
Calcium: 9.7 mg/dL (ref 8.6–10.4)
Chloride: 102 mmol/L (ref 98–110)
Creat: 0.69 mg/dL (ref 0.60–1.00)
Glucose, Bld: 137 mg/dL — ABNORMAL HIGH (ref 65–99)
Potassium: 4 mmol/L (ref 3.5–5.3)
Sodium: 138 mmol/L (ref 135–146)

## 2023-03-21 LAB — SEDIMENTATION RATE: Sed Rate: 14 mm/h (ref 0–30)

## 2023-03-21 LAB — C-REACTIVE PROTEIN: CRP: 3 mg/L (ref <8.0)

## 2023-04-01 ENCOUNTER — Encounter: Payer: Self-pay | Admitting: Internal Medicine

## 2023-04-04 ENCOUNTER — Ambulatory Visit (HOSPITAL_BASED_OUTPATIENT_CLINIC_OR_DEPARTMENT_OTHER): Payer: Medicare HMO

## 2023-04-08 DIAGNOSIS — S71001D Unspecified open wound, right hip, subsequent encounter: Secondary | ICD-10-CM | POA: Diagnosis not present

## 2023-05-02 ENCOUNTER — Encounter (HOSPITAL_COMMUNITY): Payer: Self-pay | Admitting: Diagnostic Radiology

## 2023-05-02 ENCOUNTER — Other Ambulatory Visit (HOSPITAL_COMMUNITY): Payer: Self-pay | Admitting: Orthopedic Surgery

## 2023-05-02 DIAGNOSIS — S71101D Unspecified open wound, right thigh, subsequent encounter: Secondary | ICD-10-CM

## 2023-05-06 ENCOUNTER — Other Ambulatory Visit: Payer: Self-pay

## 2023-05-06 ENCOUNTER — Telehealth: Payer: Self-pay

## 2023-05-06 ENCOUNTER — Ambulatory Visit: Payer: Medicare HMO | Admitting: Internal Medicine

## 2023-05-06 ENCOUNTER — Encounter: Payer: Self-pay | Admitting: Internal Medicine

## 2023-05-06 VITALS — BP 148/85 | HR 78 | Resp 16 | Ht 64.0 in | Wt 141.0 lb

## 2023-05-06 DIAGNOSIS — S81801A Unspecified open wound, right lower leg, initial encounter: Secondary | ICD-10-CM

## 2023-05-06 MED ORDER — DEXTROSE 5 % IV SOLN: 1500.0000 mg | Freq: Once | INTRAVENOUS | 0 refills | Status: AC

## 2023-05-06 NOTE — Telephone Encounter (Signed)
Patient is scheduled with [facility] at 11:30 AM Monday 05/12/23 to receive 1500 mg Dalbavancin on day 1 and day 8 per Dr.Snider orders placed. Marchia Bond with Infusion has informed patient of appt time and location.

## 2023-05-06 NOTE — Progress Notes (Signed)
Patient ID: Janet Mcdonald, female   DOB: June 16, 1946, 77 y.o.   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 She has been on  augmentin x 3 wk, noticing decrease in drainage but still present. She was restarted on abtx by dr Darrelyn Hillock since increased output when she was off of abtx. Hx of mrsa infection in the past. This bout has been going on now since may 11th. Has upcoming scan  CLINICAL DATA:  Open wound RIGHT hip, chronic draining soft tissue wound post spider bite in 2018 with subsequent infection, prior BILATERAL hip replacement surgery with the 2003 on RIGHT and 2004 on LEFT. RIGHT femur fracture with fixation in 2021   EXAM: NUCLEAR MEDICINE 3-PHASE BONE SCAN   TECHNIQUE: Radionuclide angiographic images, immediate static blood pool images, and 3-hour delayed static images were obtained of the hips and thighs after intravenous injection of radiopharmaceutical.   RADIOPHARMACEUTICALS:  20.7 mCi Tc-69m MDP IV   COMPARISON:  CT RIGHT femur 02/02/2021   FINDINGS: Vascular phase: Normal blood flow to both hips   Blood pool phase: Minimally increased blood pool at the soft tissues adjacent to the proximal RIGHT femur. Additional focal increased blood pool at medial posterior proximal RIGHT thigh.   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 No abnormal tracer localization adjacent to the LEFT hip prosthesis. On the RIGHT, increased tracer uptake is seen surrounding at the femoral component of the RIGHT hip prosthesis, focally greatest at the greater trochanter. This pattern could be seen with infection or loosening of the RIGHT hip prosthesis. Focal tracer uptake is seen at the mid RIGHT femur and somewhat more diffusely at the distal RIGHT femoral metadiaphysis, corresponding to fracture and hardware placement.   IMPRESSION: LEFT hip prosthesis without acute  complication.   Uptake of tracer adjacent to the femoral component of the RIGHT hip prosthesis question infection versus aseptic loosening.   Additional uptake in mid and distal RIGHT femur corresponding to RIGHT femoral fracture and ORIF.  Outpatient Encounter Medications as of 05/06/2023  Medication Sig   amoxicillin-clavulanate (AUGMENTIN) 875-125 MG tablet Take 1 tablet by mouth 2 (two) times daily.   aspirin EC 81 MG tablet Take 1 tablet (81 mg total) by mouth 2 (two) times daily.   calcium carbonate (TUMS - DOSED IN MG ELEMENTAL CALCIUM) 500 MG chewable tablet Chew 500 mg by mouth daily.    ferrous sulfate 325 (65 FE) MG tablet Take 1 tablet (325 mg total) by mouth 2 (two) times daily with a meal.   lisinopril-hydrochlorothiazide (PRINZIDE,ZESTORETIC) 20-25 MG per tablet Take 1 tablet by mouth every morning.   metoprolol (LOPRESSOR) 100 MG tablet Take 100 mg by mouth 2 (two) times daily.   Multiple Vitamin (MULTIVITAMIN WITH MINERALS) TABS tablet Take 1 tablet by mouth at bedtime.    potassium chloride (KLOR-CON) 10 MEQ tablet Take 10 mEq by mouth daily.   senna (SENOKOT) 8.6 MG TABS tablet Take 2 tablets (17.2 mg total) by mouth 2 (two) times daily.   Turmeric 500 MG CAPS Take 2,000 mg by mouth daily at 12 noon.    clindamycin (CLEOCIN) 300 MG capsule Take 300 mg by mouth 2 (two) times daily. (Patient not taking: Reported on 05/06/2023)   doxycycline (VIBRA-TABS) 100 MG tablet Take 1 tablet twice a day by oral route as directed for 14 days. (Patient not taking: Reported on 05/06/2023)   [  DISCONTINUED] amoxicillin-clavulanate (AUGMENTIN) 875-125 MG tablet Take 0.05 tablets by mouth once.  (Patient not taking: Reported on 03/20/2023)   No facility-administered encounter medications on file as of 05/06/2023.     Patient Active Problem List   Diagnosis Date Noted   Femur fracture, right (HCC) 12/28/2019     Health Maintenance Due  Topic Date Due   COVID-19 Vaccine (1) Never done    Hepatitis C Screening  Never done   DTaP/Tdap/Td (1 - Tdap) Never done   Colonoscopy  Never done   MAMMOGRAM  Never done   Zoster Vaccines- Shingrix (1 of 2) Never done   Pneumonia Vaccine 32+ Years old (1 of 1 - PCV) 10/19/2013   DEXA SCAN  Never done   Medicare Annual Wellness (AWV)  06/14/2023     Review of Systems  Physical Exam   BP (!) 148/85   Pulse 78   Resp 16   Ht 5\' 4"  (1.626 m)   Wt 141 lb (64 kg)   SpO2 98%   BMI 24.20 kg/m   Physical Exam  Constitutional: He is oriented to person, place, and time. He appears well-developed and well-nourished. No distress.  HENT:  Mouth/Throat: Oropharynx is clear and moist. No oropharyngeal exudate.  ZOX:WRUEA thigh, draining sinus. Serous drainage on dressing Skin: Skin is warm and dry. No rash noted. No erythema.  Psychiatric: she has a normal mood and affect. She behavior is normal.    CBC Lab Results  Component Value Date   WBC 7.8 03/20/2023   RBC 4.87 03/20/2023   HGB 13.8 03/20/2023   HCT 41.7 03/20/2023   PLT 306 03/20/2023   MCV 85.6 03/20/2023   MCH 28.3 03/20/2023   MCHC 33.1 03/20/2023   RDW 13.0 03/20/2023   LYMPHSABS 1,810 03/20/2023   MONOABS 0.5 12/28/2019   EOSABS 218 03/20/2023    BMET Lab Results  Component Value Date   NA 138 03/20/2023   K 4.0 03/20/2023   CL 102 03/20/2023   CO2 28 03/20/2023   GLUCOSE 137 (H) 03/20/2023   BUN 17 03/20/2023   CREATININE 0.69 03/20/2023   CALCIUM 9.7 03/20/2023   GFRNONAA >60 12/31/2019   GFRAA >60 12/31/2019   Lab Results  Component Value Date   ESRSEDRATE 14 03/20/2023   Lab Results  Component Value Date   CRP 3.0 03/20/2023      Assessment and Plan  Will arrange for 2 doses of dalbavancin 1500mg  iv over , on day 1 and day 8 @ infusion center. Has upcoming scan scheduled via dr Netta Corrigan to see if Still may need to get implant removed vs. Seeing plastic surgery to help heal the fistulous tract repair.  Diagnosis: Deep tissue  infection/ vs. PJI  Culture Result: hx of mrsa  Allergies  Allergen Reactions   Oxycodone Other (See Comments)    Psychosis   Tetracyclines & Related Nausea And Vomiting    OPAT Orders Discharge antibiotics to be given via PICC line Discharge antibiotics: Per pharmacy protocol  dalbavancin 1500mg  IV on day 1 and day 8  Clinic Follow Up Appt: 4 wk  @ [facility]  I have personally spent 40 minutes involved in face-to-face and non-face-to-face activities for this patient on the day of the visit. Professional time spent includes the following activities: Preparing to see the patient (review of tests), Obtaining and/or reviewing separately obtained history (admission/discharge record), Performing a medically appropriate examination and/or evaluation , Ordering medications/tests/procedures, referring and communicating with other health care professionals, Documenting  clinical information in the EMR, Independently interpreting results (not separately reported), Communicating results to the patient/family/caregiver, Counseling and educating the patient/family/caregiver and Care coordination (not separately reported).

## 2023-05-07 ENCOUNTER — Telehealth: Payer: Self-pay

## 2023-05-07 ENCOUNTER — Encounter: Payer: Self-pay | Admitting: Internal Medicine

## 2023-05-07 DIAGNOSIS — T8450XA Infection and inflammatory reaction due to unspecified internal joint prosthesis, initial encounter: Secondary | ICD-10-CM | POA: Insufficient documentation

## 2023-05-07 NOTE — Telephone Encounter (Signed)
Orders placed for Infusion.

## 2023-05-09 ENCOUNTER — Ambulatory Visit (HOSPITAL_COMMUNITY)
Admission: RE | Admit: 2023-05-09 | Discharge: 2023-05-09 | Disposition: A | Discharge status: Home / Self Care | Payer: Medicare HMO | Source: Ambulatory Visit | Attending: Orthopedic Surgery | Admitting: Orthopedic Surgery

## 2023-05-09 ENCOUNTER — Other Ambulatory Visit (HOSPITAL_COMMUNITY): Payer: Self-pay | Admitting: Orthopedic Surgery

## 2023-05-09 DIAGNOSIS — S71101D Unspecified open wound, right thigh, subsequent encounter: Secondary | ICD-10-CM

## 2023-05-09 DIAGNOSIS — X58XXXD Exposure to other specified factors, subsequent encounter: Secondary | ICD-10-CM | POA: Diagnosis not present

## 2023-05-09 DIAGNOSIS — L988 Other specified disorders of the skin and subcutaneous tissue: Secondary | ICD-10-CM | POA: Diagnosis not present

## 2023-05-09 HISTORY — PX: IR SINUS/FIST TUBE CHK-NON GI: IMG673

## 2023-05-09 HISTORY — PX: IR 3D INDEPENDENT WKST: IMG2385

## 2023-05-09 MED ORDER — IOHEXOL 300 MG/ML  SOLN
50.0000 mL | Freq: Once | INTRAMUSCULAR | Status: AC | PRN
  Administered 2023-05-09: 30 mL

## 2023-05-09 MED ORDER — LIDOCAINE HCL 1 % IJ SOLN
INTRAMUSCULAR | Status: AC
  Filled 2023-05-09: qty 20

## 2023-05-09 NOTE — Procedures (Signed)
Interventional Radiology Procedure Note  Procedure:   Sinogram of right posterior thigh sinus tract.   Fluoro images with contrast injection.  Cone beam CT images acquired.    Complications: None  Recommendations:  - DC home - continue care - Routine wound care  - Expect some drainage (contrast, minimal blood products) over next few days  Signed,  Yvone Neu. Loreta Ave, DO

## 2023-05-10 LAB — AEROBIC CULTURE
AER RESULT:: NO GROWTH
MICRO NUMBER:: 15156057
SPECIMEN QUALITY:: ADEQUATE

## 2023-05-12 ENCOUNTER — Encounter (HOSPITAL_COMMUNITY): Payer: Self-pay | Admitting: Emergency Medicine

## 2023-05-12 ENCOUNTER — Emergency Department (HOSPITAL_COMMUNITY): Payer: Medicare HMO

## 2023-05-12 ENCOUNTER — Ambulatory Visit (INDEPENDENT_AMBULATORY_CARE_PROVIDER_SITE_OTHER): Payer: Medicare HMO

## 2023-05-12 ENCOUNTER — Other Ambulatory Visit: Payer: Self-pay

## 2023-05-12 ENCOUNTER — Emergency Department (HOSPITAL_COMMUNITY)
Admission: EM | Admit: 2023-05-12 | Discharge: 2023-05-12 | Disposition: A | Discharge status: Home / Self Care | Payer: Medicare HMO | Attending: Emergency Medicine | Admitting: Emergency Medicine

## 2023-05-12 VITALS — BP 184/94 | HR 66 | Temp 98.9°F | Resp 1 | Ht 64.0 in | Wt 139.8 lb

## 2023-05-12 DIAGNOSIS — R0789 Other chest pain: Secondary | ICD-10-CM | POA: Insufficient documentation

## 2023-05-12 DIAGNOSIS — M545 Low back pain, unspecified: Secondary | ICD-10-CM | POA: Diagnosis not present

## 2023-05-12 DIAGNOSIS — T8451XA Infection and inflammatory reaction due to internal right hip prosthesis, initial encounter: Secondary | ICD-10-CM | POA: Diagnosis not present

## 2023-05-12 DIAGNOSIS — R079 Chest pain, unspecified: Secondary | ICD-10-CM | POA: Diagnosis not present

## 2023-05-12 DIAGNOSIS — R0602 Shortness of breath: Secondary | ICD-10-CM | POA: Diagnosis not present

## 2023-05-12 DIAGNOSIS — Z743 Need for continuous supervision: Secondary | ICD-10-CM | POA: Diagnosis not present

## 2023-05-12 DIAGNOSIS — M549 Dorsalgia, unspecified: Secondary | ICD-10-CM | POA: Diagnosis not present

## 2023-05-12 DIAGNOSIS — T8450XA Infection and inflammatory reaction due to unspecified internal joint prosthesis, initial encounter: Secondary | ICD-10-CM

## 2023-05-12 DIAGNOSIS — I10 Essential (primary) hypertension: Secondary | ICD-10-CM | POA: Diagnosis not present

## 2023-05-12 DIAGNOSIS — T50905A Adverse effect of unspecified drugs, medicaments and biological substances, initial encounter: Secondary | ICD-10-CM | POA: Diagnosis not present

## 2023-05-12 LAB — BASIC METABOLIC PANEL
Anion gap: 10 (ref 5–15)
BUN: 16 mg/dL (ref 8–23)
CO2: 22 mmol/L (ref 22–32)
Calcium: 8.7 mg/dL — ABNORMAL LOW (ref 8.9–10.3)
Chloride: 105 mmol/L (ref 98–111)
Creatinine, Ser: 0.67 mg/dL (ref 0.44–1.00)
GFR, Estimated: >60 mL/min (ref >60)
Glucose, Bld: 116 mg/dL — ABNORMAL HIGH (ref 70–99)
Potassium: 3.6 mmol/L (ref 3.5–5.1)
Sodium: 137 mmol/L (ref 135–145)

## 2023-05-12 LAB — CBC
HCT: 42.2 % (ref 36.0–46.0)
Hemoglobin: 14 g/dL (ref 12.0–15.0)
MCH: 28.4 pg (ref 26.0–34.0)
MCHC: 33.2 g/dL (ref 30.0–36.0)
MCV: 85.6 fL (ref 80.0–100.0)
Platelets: 175 10*3/uL (ref 150–400)
RBC: 4.93 MIL/uL (ref 3.87–5.11)
RDW: 13.1 % (ref 11.5–15.5)
WBC: 6.7 10*3/uL (ref 4.0–10.5)
nRBC: 0 % (ref 0.0–0.2)

## 2023-05-12 LAB — TROPONIN I (HIGH SENSITIVITY)
Troponin I (High Sensitivity): 3 ng/L (ref <18)
Troponin I (High Sensitivity): 3 ng/L (ref <18)

## 2023-05-12 MED ORDER — FAMOTIDINE IN NACL 20-0.9 MG/50ML-% IV SOLN: 20.0000 mg | Freq: Once | INTRAVENOUS | Status: DC | PRN

## 2023-05-12 MED ORDER — EPINEPHRINE 0.3 MG/0.3ML IJ SOAJ: 0.3000 mg | Freq: Once | INTRAMUSCULAR | Status: DC | PRN

## 2023-05-12 MED ORDER — METHYLPREDNISOLONE SODIUM SUCC 125 MG IJ SOLR: 125.0000 mg | Freq: Once | INTRAMUSCULAR | Status: DC | PRN

## 2023-05-12 MED ORDER — SODIUM CHLORIDE 0.9 % IV SOLN: Freq: Once | INTRAVENOUS | Status: AC | PRN

## 2023-05-12 MED ORDER — ALBUTEROL SULFATE HFA 108 (90 BASE) MCG/ACT IN AERS
2.0000 | INHALATION_SPRAY | Freq: Once | RESPIRATORY_TRACT | Status: AC | PRN
  Administered 2023-05-12: 2 via RESPIRATORY_TRACT

## 2023-05-12 MED ORDER — DEXTROSE 5 % IV SOLN
1500.0000 mg | Freq: Once | INTRAVENOUS | Status: AC
  Administered 2023-05-12: 1500 mg via INTRAVENOUS
  Filled 2023-05-12: qty 75

## 2023-05-12 MED ORDER — DIPHENHYDRAMINE HCL 50 MG/ML IJ SOLN: 50.0000 mg | Freq: Once | INTRAMUSCULAR | Status: DC | PRN

## 2023-05-12 NOTE — Progress Notes (Signed)
At approximately 1133, patient began complaining of chest heaviness that progressed to chest pain with breathing. Patient stated she also experienced pain in lower/center of back. Dalbavancin stopped. 2 puffs Albuterol administered at 1135 and patient stated that her symptoms improved. Supervisor on site and notified at beginning of incident; supervisor contacted Dr. Drue Second. Per Dr. Drue Second, NS started at [facility] rate. Patient's VS improving. EMS called. Patient stated she took her metoprolol, lisinopril, and potassium a little later than her normal time, approximately 45 minutes before arriving at infusion clinic.  EMS arrived at 1155. NS stopped. Patient stated symptoms have completely resolved. Transported via EMS.

## 2023-05-12 NOTE — Progress Notes (Signed)
Spoke with Dr. Drue Second regarding patient symptoms and elevated blood pressure. Agreed with plan to send patient to ED for evaluation of chest pain and shortness of breath. Patient agreeable to transfer and notified her family via telephone.

## 2023-05-12 NOTE — ED Notes (Signed)
EDP notified about pt. States no orders need at this time.

## 2023-05-12 NOTE — ED Triage Notes (Signed)
Pt arrives via EMS from infusion clinic where pt was getting Dalbavancin and began having SOB lower back pain and chest discomfort. Pt had received 5 min of treatment before stopping. No symptoms at this time and pt feels baseline.  182/94 66 HR

## 2023-05-12 NOTE — Discharge Instructions (Signed)
You have been seen today for your complaint of medication reaction. Your lab work was reassuring. Your imaging was reassuring. Follow up with: Your doctor within the next week Please seek immediate medical care if you develop any of the following symptoms: Your chest pain gets worse. You have a cough that gets worse, or you cough up blood. You have severe pain in your abdomen. You faint. You have sudden, unexplained chest discomfort. You have sudden, unexplained discomfort in your arms, back, neck, or jaw. You have shortness of breath at any time. You suddenly start to sweat, or your skin gets clammy. You feel nausea or you vomit. You suddenly feel lightheaded or dizzy. You have severe weakness, or unexplained weakness or fatigue. Your heart begins to beat quickly, or it feels like it is skipping beats. At this time there does not appear to be the presence of an emergent medical condition, however there is always the potential for conditions to change. Please read and follow the below instructions.  Do not take your medicine if  develop an itchy rash, swelling in your mouth or lips, or difficulty breathing; call 911 and seek immediate emergency medical attention if this occurs.  You may review your lab tests and imaging results in their entirety on your MyChart account.  Please discuss all results of fully with your primary care provider and other specialist at your follow-up visit.  Note: Portions of this text may have been transcribed using voice recognition software. Every effort was made to ensure accuracy; however, inadvertent computerized transcription errors may still be present.

## 2023-05-12 NOTE — ED Provider Notes (Signed)
[place] EMERGENCY DEPARTMENT AT [facility] Provider Note   CSN: [id number] Arrival date & time: 05/12/23  1222     History  No chief complaint on file.   Janet Mcdonald is a 77 y.o. female.  With history of hypertension who presents to the ED for evaluation of a medication reaction.  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  Less than 10 minutes into the infusion she began to have sudden onset chest tightness across the entire anterior chest, shortness of breath and low back pain.  Infusion was stopped, patient was given an albuterol inhaler and the symptoms subsided within 5 minutes.  Patient states she believes this is likely a medication reaction.  She was reluctant to accept EMS transfer to the emergency department.  She denies any cardiac history.  She denies feeling any different than normal at this time.  No fevers or cough.  No nausea or vomiting.  No shortness of breath.  HPI     Home Medications Prior to Admission medications   Medication Sig Start Date End Date Taking? Authorizing Provider  amoxicillin-clavulanate (AUGMENTIN) 875-125 MG tablet Take 1 tablet by mouth 2 (two) times daily.    [provider]  aspirin EC 81 MG tablet Take 1 tablet (81 mg total) by mouth 2 (two) times daily. 12/29/19   Jacinta Shoe, PA-C  calcium carbonate (TUMS - DOSED IN MG ELEMENTAL CALCIUM) 500 MG chewable tablet Chew 500 mg by mouth daily.     [provider]  clindamycin (CLEOCIN) 300 MG capsule Take 300 mg by mouth 2 (two) times daily. Patient not taking: Reported on 05/06/2023 01/20/23   [provider]  doxycycline (VIBRA-TABS) 100 MG tablet Take 1 tablet twice a day by oral route as directed for 14 days. Patient not taking: Reported on 05/06/2023    [provider]  ferrous sulfate 325 (65 FE) MG tablet Take 1 tablet (325 mg total) by mouth 2 (two) times daily  with a meal. 12/31/19   Regalado, Belkys A, MD  lisinopril-hydrochlorothiazide (PRINZIDE,ZESTORETIC) 20-25 MG per tablet Take 1 tablet by mouth every morning.    [provider]  metoprolol (LOPRESSOR) 100 MG tablet Take 100 mg by mouth 2 (two) times daily.    [provider]  Multiple Vitamin (MULTIVITAMIN WITH MINERALS) TABS tablet Take 1 tablet by mouth at bedtime.     [provider]  potassium chloride (KLOR-CON) 10 MEQ tablet Take 10 mEq by mouth daily.    [provider]  senna (SENOKOT) 8.6 MG TABS tablet Take 2 tablets (17.2 mg total) by mouth 2 (two) times daily. 12/29/19   Jacinta Shoe, PA-C  Turmeric 500 MG CAPS Take 2,000 mg by mouth daily at 12 noon.     [provider]      Allergies    Oxycodone and Tetracyclines & related    Review of Systems   Review of Systems  Respiratory:  Positive for chest tightness and shortness of breath.   Musculoskeletal:  Positive for back pain.  All other systems reviewed and are negative.   Physical Exam Updated Vital Signs BP (!) 170/85   Pulse 60   Temp 98.3 F (36.8 C) (Oral)   Resp 18   SpO2 100%  Physical Exam Vitals and nursing note reviewed.  Constitutional:      General: She is not in acute distress.  Appearance: She is well-developed.     Comments: Resting comfortably in wheelchair  HENT:     Head: Normocephalic and atraumatic.  Eyes:     Conjunctiva/sclera: Conjunctivae normal.  Cardiovascular:     Rate and Rhythm: Normal rate and regular rhythm.     Heart sounds: No murmur heard. Pulmonary:     Effort: Pulmonary effort is normal. No respiratory distress.     Breath sounds: Normal breath sounds. No wheezing, rhonchi or rales.  Abdominal:     Palpations: Abdomen is soft.     Tenderness: There is no abdominal tenderness.  Musculoskeletal:        General: No swelling.     Cervical back: Neck supple.  Skin:    General: Skin is warm and dry.     Capillary Refill:  Capillary refill takes less than 2 seconds.  Neurological:     General: No focal deficit present.     Mental Status: She is alert and oriented to person, place, and time.  Psychiatric:        Mood and Affect: Mood normal.     ED Results / Procedures / Treatments   Labs (all labs ordered are listed, but only abnormal results are displayed) Labs Reviewed  BASIC METABOLIC PANEL - Abnormal; Notable for the following components:      Result Value   Glucose, Bld 116 (*)    Calcium 8.7 (*)    All other components within normal limits  CBC  TROPONIN I (HIGH SENSITIVITY)  TROPONIN I (HIGH SENSITIVITY)    EKG EKG Interpretation Date/Time:  Monday May 12 2023 13:58:31 EDT Ventricular Rate:  59 PR Interval:  154 QRS Duration:  94 QT Interval:  441 QTC Calculation: 437 R Axis:   -19  Text Interpretation: Sinus rhythm Borderline left axis deviation Abnormal R-wave progression, early transition Confirmed by Pricilla Loveless [phone]) on 05/12/2023 2:44:26 PM  Radiology DG Chest 2 View  Result Date: 05/12/2023 CLINICAL DATA:  Chest discomfort, shortness of breath EXAM: CHEST - 2 VIEW COMPARISON:  Chest radiograph 12/28/2019 FINDINGS: The cardiomediastinal silhouette is normal There is no focal consolidation or pulmonary edema. There is no pleural effusion or pneumothorax There is no acute osseous abnormality. IMPRESSION: No radiographic evidence of acute cardiopulmonary process. Electronically Signed   By: Lesia Hausen M.D.   On: 05/12/2023 14:26    Procedures Procedures    Medications Ordered in ED Medications - No data to display  ED Course/ Medical Decision Making/ A&P             HEART Score: 3                Medical Decision Making Amount and/or Complexity of Data Reviewed Labs: ordered. Radiology: ordered.  This patient presents to the ED for concern of infusion reaction, chest pain, shortness of breath, low back pain, this involves an extensive number of treatment options,  and is a complaint that carries with it a high risk of complications and morbidity. The emergent differential diagnosis of chest pain includes: Acute coronary syndrome, pericarditis, aortic dissection, pulmonary embolism, tension pneumothorax, and esophageal rupture.  I do not believe the patient has an emergent cause of chest pain, other urgent/non-acute considerations include, but are not limited to: chronic angina, aortic stenosis, cardiomyopathy, myocarditis, mitral valve prolapse, pulmonary hypertension, hypertrophic obstructive cardiomyopathy (HOCM), aortic insufficiency, right ventricular hypertrophy, pneumonia, pleuritis, bronchitis, pneumothorax, tumor, gastroesophageal reflux disease (GERD), esophageal spasm, Mallory-Weiss syndrome, peptic ulcer disease, biliary disease, pancreatitis, functional gastrointestinal  pain, cervical or thoracic disk disease or arthritis, shoulder arthritis, costochondritis, subacromial bursitis, anxiety or panic attack, herpes zoster, breast disorders, chest wall tumors, thoracic outlet syndrome, mediastinitis.  Co morbidities that complicate the patient evaluation  hypertension  My initial workup includes ACS rule out  Additional history obtained from: Nursing notes from this visit. Previous records within EMR system RN note from infusion center from prior to arrival  I ordered, reviewed and interpreted labs which include: CBC, BMP, troponin.  Labs within normal limits  I ordered imaging studies including chest x-ray I independently visualized and interpreted imaging which showed normal I agree with the radiologist interpretation  Cardiac Monitoring:  The patient was maintained on a cardiac monitor.  I personally viewed and interpreted the cardiac monitored which showed an underlying rhythm of: NSR  Afebrile, hemodynamically stable.  77 year old female presenting to the ED for evaluation of a possible medication reaction.  This manifested as chest  tightness, shortness of breath, low back pain.  This lasted approximately 5 minutes and then resolved.  This occurred just prior to arrival.  She appears very well on physical exam.  She has no complaints.  She initially did not want an ACS workup, however was later agreeable to this.  EKG is without ischemic changes.  Chest x-ray normal.  Troponins negative and flat.  Heart score of 3 and I have low suspicion for ACS as the cause of her symptoms. more likely this was a medication reaction.  She has an appointment with her infectious disease provider later this week.  She was encouraged to keep this appointment.  She was encouraged to return with any new or worsening symptoms.  Stable at discharge.  At this time there does not appear to be any evidence of an acute emergency medical condition and the patient appears stable for discharge with appropriate outpatient follow up. Diagnosis was discussed with patient who verbalizes understanding of care plan and is agreeable to discharge. I have discussed return precautions with patient who verbalizes understanding. Patient encouraged to follow-up with their PCP within 1 week. All questions answered.  Patient's case discussed with Dr. Criss Alvine who agrees with plan to discharge with follow-up.   Note: Portions of this report may have been transcribed using voice recognition software. Every effort was made to ensure accuracy; however, inadvertent computerized transcription errors may still be present.        Final Clinical Impression(s) / ED Diagnoses Final diagnoses:  Adverse effect of drug, initial encounter  Atypical chest pain    Rx / DC Orders ED Discharge Orders     None         Michelle Piper, Cordelia Poche 05/12/23 1658    Pricilla Loveless, MD 05/15/23 1616

## 2023-05-13 NOTE — Progress Notes (Signed)
Diagnosis: Infection of prosthetic joint  Provider:  Chilton Greathouse MD  Procedure: IV Infusion  IV Type: Peripheral, IV Location: L Hand  Dalvance (Dalbavancin), Dose: 1500 mg  Infusion Start Time: 1122  Infusion Stop Time: 1133  Post Infusion IV Care:  Patient reacted to medication. Emergency protocols initiated and EMS called. PIV left in place. See nursing note.  Discharge: Condition: Stable, Destination: Hospital via EMS  . AVS not provided.  Performed by:  Wyvonne Lenz, RN

## 2023-05-15 DIAGNOSIS — S71001D Unspecified open wound, right hip, subsequent encounter: Secondary | ICD-10-CM | POA: Diagnosis not present

## 2023-05-15 DIAGNOSIS — L089 Local infection of the skin and subcutaneous tissue, unspecified: Secondary | ICD-10-CM | POA: Diagnosis not present

## 2023-05-15 DIAGNOSIS — S72401D Unspecified fracture of lower end of right femur, subsequent encounter for closed fracture with routine healing: Secondary | ICD-10-CM | POA: Diagnosis not present

## 2023-06-03 DIAGNOSIS — Z01818 Encounter for other preprocedural examination: Secondary | ICD-10-CM | POA: Diagnosis not present

## 2023-06-03 DIAGNOSIS — Z9889 Other specified postprocedural states: Secondary | ICD-10-CM | POA: Diagnosis not present

## 2023-06-03 DIAGNOSIS — E782 Mixed hyperlipidemia: Secondary | ICD-10-CM | POA: Diagnosis not present

## 2023-06-03 DIAGNOSIS — I10 Essential (primary) hypertension: Secondary | ICD-10-CM | POA: Diagnosis not present

## 2023-06-03 DIAGNOSIS — L089 Local infection of the skin and subcutaneous tissue, unspecified: Secondary | ICD-10-CM | POA: Diagnosis not present

## 2023-06-03 DIAGNOSIS — R7303 Prediabetes: Secondary | ICD-10-CM | POA: Diagnosis not present

## 2023-06-06 NOTE — Patient Instructions (Addendum)
SURGICAL WAITING ROOM VISITATION  Patients having surgery or a procedure may have no more than 2 support people in the waiting area - these visitors may rotate.    Children under the age of 75 must have an adult with them who is not the patient.  Due to an increase in RSV and influenza rates and associated hospitalizations, children ages 83 and under may not visit patients in [facility] hospitals.  If the patient needs to stay at the hospital during part of their recovery, the visitor guidelines for inpatient rooms apply. Pre-op nurse will coordinate an appropriate time for 1 support person to accompany patient in pre-op.  This support person may not rotate.    Please refer to the [facility] website for the visitor guidelines for Inpatients (after your surgery is over and you are in a regular room).       Your procedure is scheduled on: 06-30-23   Report to [facility] Main Entrance    Report to admitting at   1045  AM   Call this number if you have problems the morning of surgery [phone]   Do not eat food :After Midnight.   After Midnight you may have the following liquids until __10:00 ____ AM DAY OF SURGERY Then nothing by mouh  Water Non-Citrus Juices (without pulp, NO RED-Apple, White grape, White cranberry) Black Coffee (NO MILK/CREAM OR CREAMERS, sugar ok)  Clear Tea (NO MILK/CREAM OR CREAMERS, sugar ok) regular and decaf                             Plain Jell-O (NO RED)                                           Fruit ices (not with fruit pulp, NO RED)                                     Popsicles (NO RED)                                                               Sports drinks like Gatorade (NO RED)                        The day of surgery:  Drink ONE (1) Pre-Surgery G2 at  0950  AM the morning of surgery. Drink in one sitting. Do not sip.  This drink was given to you during your hospital  pre-op appointment visit. Nothing else to drink after  completing the  Pre-Surgery G2  by 10:00 am.          If you have questions, please contact your surgeon's office.   FOLLOW ANY ADDITIONAL PRE OP INSTRUCTIONS YOU RECEIVED FROM YOUR SURGEON'S OFFICE!!!     Oral Hygiene is also important to reduce your risk of infection.  Remember - BRUSH YOUR TEETH THE MORNING OF SURGERY WITH YOUR REGULAR TOOTHPASTE  DENTURES WILL BE REMOVED PRIOR TO SURGERY PLEASE DO NOT APPLY "Poly grip" OR ADHESIVES!!!   Do NOT smoke after Midnight   Stop all vitamins and herbal supplements 7 days before surgery.   Take these medicines the morning of surgery with A SIP OF WATER: antibiotic , metoprolol  DO NOT TAKE ANY ORAL DIABETIC MEDICATIONS DAY OF YOUR SURGERY  Bring CPAP mask and tubing day of surgery.                              You may not have any metal on your body including hair pins, jewelry, and body piercing             Do not wear make-up, lotions, powders, perfumes/cologne, or deodorant  Do not wear nail polish including gel and S&S, artificial/acrylic nails, or any other type of covering on natural nails including finger and toenails. If you have artificial nails, gel coating, etc. that needs to be removed by a nail salon please have this removed prior to surgery or surgery may need to be canceled/ delayed if the surgeon/ anesthesia feels like they are unable to be safely monitored.   Do not shave  5 days prior to surgery.              Do not bring valuables to the hospital. [facility] IS NOT             RESPONSIBLE   FOR VALUABLES.   Contacts, glasses, dentures or bridgework may not be worn into surgery.   Bring small overnight bag day of surgery.   DO NOT BRING YOUR HOME MEDICATIONS TO THE HOSPITAL. PHARMACY WILL DISPENSE MEDICATIONS LISTED ON YOUR MEDICATION LIST TO YOU DURING YOUR ADMISSION IN THE HOSPITAL!    Patients discharged on the day of surgery will not be allowed to drive home.  Someone  NEEDS to stay with you for the first 24 hours after anesthesia.   Special Instructions: Bring a copy of your healthcare power of attorney and living will documents the day of surgery if you haven't scanned them before.              Please read over the following fact sheets you were given: IF YOU HAVE QUESTIONS ABOUT YOUR PRE-OP INSTRUCTIONS PLEASE CALL [phone]    If you test positive for Covid or have been in contact with anyone that has tested positive in the last 10 days please notify you surgeon.    [name] - Preparing for Surgery Before surgery, you can play an important role.  Because skin is not sterile, your skin needs to be as free of germs as possible.  You can reduce the number of germs on your skin by washing with CHG (chlorahexidine gluconate) soap before surgery.  CHG is an antiseptic cleaner which kills germs and bonds with the skin to continue killing germs even after washing. Please DO NOT use if you have an allergy to CHG or antibacterial soaps.  If your skin becomes reddened/irritated stop using the CHG and inform your nurse when you arrive at Short Stay. Do not shave (including legs and underarms) for at least 48 hours prior to the first CHG shower.  You may shave your face/neck. Please follow these instructions carefully:  1.  Shower with CHG Soap the night before surgery and the  morning of  Surgery.  2.  If you choose to wash your hair, wash your hair first as usual with your  normal  shampoo.  3.  After you shampoo, rinse your hair and body thoroughly to remove the  shampoo.                           4.  Use CHG as you would any other liquid soap.  You can apply chg directly  to the skin and wash                       Gently with a scrungie or clean washcloth.  5.  Apply the CHG Soap to your body ONLY FROM THE NECK DOWN.   Do not use on face/ open                           Wound or open sores. Avoid contact with eyes, ears mouth and genitals (private parts).                        Wash face,  Genitals (private parts) with your normal soap.             6.  Wash thoroughly, paying special attention to the area where your surgery  will be performed.  7.  Thoroughly rinse your body with warm water from the neck down.  8.  DO NOT shower/wash with your normal soap after using and rinsing off  the CHG Soap.                9.  Pat yourself dry with a clean towel.            10.  Wear clean pajamas.            11.  Place clean sheets on your bed the night of your first shower and do not  sleep with pets. Day of Surgery : Do not apply any lotions/deodorants the morning of surgery.  Please wear clean clothes to the hospital/surgery center.  FAILURE TO FOLLOW THESE INSTRUCTIONS MAY RESULT IN THE CANCELLATION OF YOUR SURGERY PATIENT SIGNATURE_________________________________  NURSE SIGNATURE__________________________________  ________________________________________________________________________ WHAT IS A BLOOD TRANSFUSION? Blood Transfusion Information  A transfusion is the replacement of blood or some of its parts. Blood is made up of multiple cells which provide different functions. Red blood cells carry oxygen and are used for blood loss replacement. White blood cells fight against infection. Platelets control bleeding. Plasma helps clot blood. Other blood products are available for specialized needs, such as hemophilia or other clotting disorders. BEFORE THE TRANSFUSION  Who gives blood for transfusions?  Healthy volunteers who are fully evaluated to make sure their blood is safe. This is blood bank blood. Transfusion therapy is the safest it has ever been in the practice of medicine. Before blood is taken from a donor, a complete history is taken to make sure that person has no history of diseases nor engages in risky social behavior (examples are intravenous drug use or sexual activity with multiple partners). The donor's travel history is screened to  minimize risk of transmitting infections, such as malaria. The donated blood is tested for signs of infectious diseases, such as HIV and hepatitis. The blood is then tested to be sure it is compatible with you in order to minimize the chance of a transfusion reaction. If you  or a relative donates blood, this is often done in anticipation of surgery and is not appropriate for emergency situations. It takes many days to process the donated blood. RISKS AND COMPLICATIONS Although transfusion therapy is very safe and saves many lives, the main dangers of transfusion include:  Getting an infectious disease. Developing a transfusion reaction. This is an allergic reaction to something in the blood you were given. Every precaution is taken to prevent this. The decision to have a blood transfusion has been considered carefully by your caregiver before blood is given. Blood is not given unless the benefits outweigh the risks. AFTER THE TRANSFUSION Right after receiving a blood transfusion, you will usually feel much better and more energetic. This is especially true if your red blood cells have gotten low (anemic). The transfusion raises the level of the red blood cells which carry oxygen, and this usually causes an energy increase. The nurse administering the transfusion will monitor you carefully for complications. HOME CARE INSTRUCTIONS  No special instructions are needed after a transfusion. You may find your energy is better. Speak with your caregiver about any limitations on activity for underlying diseases you may have. SEEK MEDICAL CARE IF:  Your condition is not improving after your transfusion. You develop redness or irritation at the intravenous (IV) site. SEEK IMMEDIATE MEDICAL CARE IF:  Any of the following symptoms occur over the next 12 hours: Shaking chills. You have a temperature by mouth above 102 F (38.9 C), not controlled by medicine. Chest, back, or muscle pain. People around you feel  you are not acting correctly or are confused. Shortness of breath or difficulty breathing. Dizziness and fainting. You get a rash or develop hives. You have a decrease in urine output. Your urine turns a dark color or changes to pink, red, or brown. Any of the following symptoms occur over the next 10 days: You have a temperature by mouth above 102 F (38.9 C), not controlled by medicine. Shortness of breath. Weakness after normal activity. The white part of the eye turns yellow (jaundice). You have a decrease in the amount of urine or are urinating less often. Your urine turns a dark color or changes to pink, red, or brown. Document Released: 10/18/2000 Document Revised: 01/13/2012 Document Reviewed: 06/06/2008 [employer] Patient Information 2014 [place], [place].  _______________________________________________________________________  Incentive Spirometer  An incentive spirometer is a tool that can help keep your lungs clear and active. This tool measures how well you are filling your lungs with each breath. Taking long deep breaths may help reverse or decrease the chance of developing breathing (pulmonary) problems (especially infection) following: A long period of time when you are unable to move or be active. BEFORE THE PROCEDURE  If the spirometer includes an indicator to show your best effort, your nurse or respiratory therapist will set it to a desired goal. If possible, sit up straight or lean slightly forward. Try not to slouch. Hold the incentive spirometer in an upright position. INSTRUCTIONS FOR USE  Sit on the edge of your bed if possible, or sit up as far as you can in bed or on a chair. Hold the incentive spirometer in an upright position. Breathe out normally. Place the mouthpiece in your mouth and seal your lips tightly around it. Breathe in slowly and as deeply as possible, raising the piston or the ball toward the top of the column. Hold your breath for 3-5 seconds  or for as long as possible. Allow the piston or ball  to fall to the bottom of the column. Remove the mouthpiece from your mouth and breathe out normally. Rest for a few seconds and repeat Steps 1 through 7 at least 10 times every 1-2 hours when you are awake. Take your time and take a few normal breaths between deep breaths. The spirometer may include an indicator to show your best effort. Use the indicator as a goal to work toward during each repetition. After each set of 10 deep breaths, practice coughing to be sure your lungs are clear. If you have an incision (the cut made at the time of surgery), support your incision when coughing by placing a pillow or rolled up towels firmly against it. Once you are able to get out of bed, walk around indoors and cough well. You may stop using the incentive spirometer when instructed by your caregiver.  RISKS AND COMPLICATIONS Take your time so you do not get dizzy or light-headed. If you are in pain, you may need to take or ask for pain medication before doing incentive spirometry. It is harder to take a deep breath if you are having pain. AFTER USE Rest and breathe slowly and easily. It can be helpful to keep track of a log of your progress. Your caregiver can provide you with a simple table to help with this. If you are using the spirometer at home, follow these instructions: SEEK MEDICAL CARE IF:  You are having difficultly using the spirometer. You have trouble using the spirometer as often as instructed. Your pain medication is not giving enough relief while using the spirometer. You develop fever of 100.5 F (38.1 C) or higher. SEEK IMMEDIATE MEDICAL CARE IF:  You cough up bloody sputum that had not been present before. You develop fever of 102 F (38.9 C) or greater. You develop worsening pain at or near the incision site. MAKE SURE YOU:  Understand these instructions. Will watch your condition. Will get help right away if you are not doing  well or get worse. Document Released: 03/03/2007 Document Revised: 01/13/2012 Document Reviewed: 05/04/2007 [facility] Patient Information 2014 [place], [place].   ________________________________________________________________________

## 2023-06-06 NOTE — Progress Notes (Signed)
Please place orders in epic pt. Is scheduled for preop 

## 2023-06-06 NOTE — Progress Notes (Addendum)
PCP - Aliene Beams, MD [facility]  LOV 06-05-23 CE Cardiologist - no  PPM/ICD -  Device Orders -  Rep Notified -   Chest x-ray -  EKG - 05-12-23 epic Stress Test -  ECHO -  Cardiac Cath -   Sleep Study -  CPAP -   Fasting Blood Sugar -  Checks Blood Sugar _____ times a day  Blood Thinner Instructions: Aspirin Instructions: 81 mg stop 1 week  ERAS Protcol - PRE-SURGERY G2-   COVID vaccine -yes up to date  Activity--Able to climb a flight of stairs without SOB or CP Anesthesia review: HTN , pre DM,   Patient denies shortness of breath, fever, cough and chest pain at PAT appointment   All instructions explained to the patient, with a verbal understanding of the material. Patient agrees to go over the instructions while at home for a better understanding. Patient also instructed to self quarantine after being tested for COVID-19. The opportunity to ask questions was provided.

## 2023-06-09 NOTE — Progress Notes (Signed)
I am requesting pre-op orders in epic for day of surgery: Pre op appointment is on:  Thank you, Rockwell Alexandria, BSN, RN

## 2023-06-12 ENCOUNTER — Other Ambulatory Visit: Payer: Self-pay

## 2023-06-12 ENCOUNTER — Telehealth: Payer: Medicare HMO | Admitting: Internal Medicine

## 2023-06-12 DIAGNOSIS — S81801A Unspecified open wound, right lower leg, initial encounter: Secondary | ICD-10-CM

## 2023-06-12 NOTE — Progress Notes (Signed)
Virtual Visit via Video Note  I connected with Janet Mcdonald on 06/12/23 at  2:00 PM EDT by a video enabled telemedicine application and verified that I am speaking with the correct person using two identifiers.  Location: Patient: at home Provider: in clinic   I discussed the limitations of evaluation and management by telemedicine and the availability of in person appointments. The patient expressed understanding and agreed to proceed.  History of Present Illness: Had severe reaction to dalbavancin required going to the ED for evaluation. Now listed as allergy. She is now on augmentin which has decreased output to draining sinus. 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 The patient was provided an opportunity to ask questions and all were answered. The patient agreed with the plan and demonstrated an understanding of the instructions.   The patient was advised to call back or seek an in-person evaluation if the symptoms worsen or if the condition fails to improve as anticipated.  I provided 22 minutes of non-face-to-face time during this encounter.   Judyann Munson, MD

## 2023-06-16 ENCOUNTER — Other Ambulatory Visit: Payer: Self-pay

## 2023-06-16 ENCOUNTER — Encounter (HOSPITAL_COMMUNITY)
Admission: RE | Admit: 2023-06-16 | Discharge: 2023-06-16 | Disposition: A | Discharge status: Home / Self Care | Payer: Medicare HMO | Source: Ambulatory Visit | Attending: Orthopedic Surgery | Admitting: Orthopedic Surgery

## 2023-06-16 ENCOUNTER — Encounter (HOSPITAL_COMMUNITY): Payer: Self-pay

## 2023-06-16 VITALS — BP 178/85 | HR 72 | Temp 98.6°F | Resp 17 | Ht 64.0 in | Wt 139.2 lb

## 2023-06-16 DIAGNOSIS — Z01812 Encounter for preprocedural laboratory examination: Secondary | ICD-10-CM | POA: Diagnosis not present

## 2023-06-16 DIAGNOSIS — I10 Essential (primary) hypertension: Secondary | ICD-10-CM | POA: Insufficient documentation

## 2023-06-16 DIAGNOSIS — T8450XD Infection and inflammatory reaction due to unspecified internal joint prosthesis, subsequent encounter: Secondary | ICD-10-CM | POA: Insufficient documentation

## 2023-06-16 DIAGNOSIS — Z01818 Encounter for other preprocedural examination: Secondary | ICD-10-CM | POA: Diagnosis not present

## 2023-06-16 HISTORY — DX: Unspecified osteoarthritis, unspecified site: M19.90

## 2023-06-16 HISTORY — DX: Prediabetes: R73.03

## 2023-06-16 LAB — BASIC METABOLIC PANEL
Anion gap: 10 (ref 5–15)
BUN: 14 mg/dL (ref 8–23)
CO2: 25 mmol/L (ref 22–32)
Calcium: 9.1 mg/dL (ref 8.9–10.3)
Chloride: 102 mmol/L (ref 98–111)
Creatinine, Ser: 0.63 mg/dL (ref 0.44–1.00)
GFR, Estimated: >60 mL/min (ref >60)
Glucose, Bld: 113 mg/dL — ABNORMAL HIGH (ref 70–99)
Potassium: 3.7 mmol/L (ref 3.5–5.1)
Sodium: 137 mmol/L (ref 135–145)

## 2023-06-16 LAB — TYPE AND SCREEN
ABO/RH(D): O POS
Antibody Screen: POSITIVE
DAT, IgG: NEGATIVE
Donor AG Type: NEGATIVE
PT AG Type: NEGATIVE
Unit division: 0

## 2023-06-16 LAB — CBC
HCT: 41.7 % (ref 36.0–46.0)
Hemoglobin: 13.9 g/dL (ref 12.0–15.0)
MCH: 28.3 pg (ref 26.0–34.0)
MCHC: 33.3 g/dL (ref 30.0–36.0)
MCV: 84.9 fL (ref 80.0–100.0)
Platelets: 178 10*3/uL (ref 150–400)
RBC: 4.91 MIL/uL (ref 3.87–5.11)
RDW: 13.2 % (ref 11.5–15.5)
WBC: 5.2 10*3/uL (ref 4.0–10.5)
nRBC: 0 % (ref 0.0–0.2)

## 2023-06-16 LAB — BPAM RBC
Blood Product Expiration Date: 202409062359
Unit Type and Rh: 5100

## 2023-06-16 LAB — SURGICAL PCR SCREEN
MRSA, PCR: NEGATIVE
Staphylococcus aureus: NEGATIVE

## 2023-06-23 DIAGNOSIS — E782 Mixed hyperlipidemia: Secondary | ICD-10-CM | POA: Diagnosis not present

## 2023-06-23 DIAGNOSIS — R7303 Prediabetes: Secondary | ICD-10-CM | POA: Diagnosis not present

## 2023-06-23 DIAGNOSIS — Z1331 Encounter for screening for depression: Secondary | ICD-10-CM | POA: Diagnosis not present

## 2023-06-23 DIAGNOSIS — Z0000 Encounter for general adult medical examination without abnormal findings: Secondary | ICD-10-CM | POA: Diagnosis not present

## 2023-06-23 DIAGNOSIS — I10 Essential (primary) hypertension: Secondary | ICD-10-CM | POA: Diagnosis not present

## 2023-06-30 ENCOUNTER — Other Ambulatory Visit: Payer: Self-pay

## 2023-06-30 ENCOUNTER — Encounter (HOSPITAL_COMMUNITY): Payer: Self-pay | Admitting: Orthopedic Surgery

## 2023-06-30 ENCOUNTER — Ambulatory Visit (HOSPITAL_COMMUNITY): Payer: Medicare HMO | Admitting: Anesthesiology

## 2023-06-30 ENCOUNTER — Ambulatory Visit (HOSPITAL_BASED_OUTPATIENT_CLINIC_OR_DEPARTMENT_OTHER): Payer: Medicare HMO | Admitting: Anesthesiology

## 2023-06-30 ENCOUNTER — Encounter (HOSPITAL_COMMUNITY)
Admission: RE | Disposition: A | Discharge status: Home / Self Care | Payer: Self-pay | Source: Ambulatory Visit | Attending: Orthopedic Surgery

## 2023-06-30 ENCOUNTER — Observation Stay (HOSPITAL_COMMUNITY)
Admission: RE | Admit: 2023-06-30 | Discharge: 2023-07-01 | Disposition: A | Discharge status: Home / Self Care | Payer: Medicare HMO | Source: Ambulatory Visit | Attending: Orthopedic Surgery | Admitting: Orthopedic Surgery

## 2023-06-30 DIAGNOSIS — Z01818 Encounter for other preprocedural examination: Secondary | ICD-10-CM

## 2023-06-30 DIAGNOSIS — S71001A Unspecified open wound, right hip, initial encounter: Secondary | ICD-10-CM | POA: Diagnosis not present

## 2023-06-30 DIAGNOSIS — S71001S Unspecified open wound, right hip, sequela: Secondary | ICD-10-CM

## 2023-06-30 DIAGNOSIS — W57XXXD Bitten or stung by nonvenomous insect and other nonvenomous arthropods, subsequent encounter: Secondary | ICD-10-CM | POA: Diagnosis not present

## 2023-06-30 DIAGNOSIS — S70361A Insect bite (nonvenomous), right thigh, initial encounter: Secondary | ICD-10-CM

## 2023-06-30 DIAGNOSIS — I10 Essential (primary) hypertension: Secondary | ICD-10-CM | POA: Diagnosis not present

## 2023-06-30 DIAGNOSIS — W57XXXA Bitten or stung by nonvenomous insect and other nonvenomous arthropods, initial encounter: Secondary | ICD-10-CM | POA: Insufficient documentation

## 2023-06-30 DIAGNOSIS — S70361S Insect bite (nonvenomous), right thigh, sequela: Secondary | ICD-10-CM | POA: Diagnosis not present

## 2023-06-30 DIAGNOSIS — R2689 Other abnormalities of gait and mobility: Secondary | ICD-10-CM | POA: Insufficient documentation

## 2023-06-30 DIAGNOSIS — T8450XD Infection and inflammatory reaction due to unspecified internal joint prosthesis, subsequent encounter: Principal | ICD-10-CM

## 2023-06-30 DIAGNOSIS — S71101A Unspecified open wound, right thigh, initial encounter: Secondary | ICD-10-CM | POA: Diagnosis not present

## 2023-06-30 HISTORY — PX: HARDWARE REMOVAL: SHX979

## 2023-06-30 SURGERY — REMOVAL, HARDWARE
Anesthesia: General | Site: Thigh | Laterality: Right

## 2023-06-30 MED ORDER — DIPHENHYDRAMINE HCL 12.5 MG/5ML PO ELIX: 12.5000 mg | ORAL_SOLUTION | ORAL | Status: DC | PRN

## 2023-06-30 MED ORDER — SODIUM CHLORIDE 0.9 % IV SOLN: INTRAVENOUS | Status: DC

## 2023-06-30 MED ORDER — PHENYLEPHRINE HCL (PRESSORS) 10 MG/ML IV SOLN
INTRAVENOUS | Status: DC | PRN
Start: 2023-06-30 — End: 2023-06-30
  Administered 2023-06-30 (×2): 80 ug via INTRAVENOUS
  Administered 2023-06-30: 120 ug via INTRAVENOUS
  Administered 2023-06-30: 80 ug via INTRAVENOUS

## 2023-06-30 MED ORDER — METHOCARBAMOL 500 MG PO TABS: 500.0000 mg | ORAL_TABLET | Freq: Four times a day (QID) | ORAL | Status: DC | PRN

## 2023-06-30 MED ORDER — ONDANSETRON HCL 4 MG/2ML IJ SOLN: 4.0000 mg | Freq: Four times a day (QID) | INTRAMUSCULAR | Status: DC | PRN

## 2023-06-30 MED ORDER — LISINOPRIL 20 MG PO TABS
20.0000 mg | ORAL_TABLET | Freq: Every day | ORAL | Status: DC
  Administered 2023-06-30 – 2023-07-01 (×2): 20 mg via ORAL
  Filled 2023-06-30 (×2): qty 1

## 2023-06-30 MED ORDER — MORPHINE SULFATE (PF) 2 MG/ML IV SOLN: 0.5000 mg | INTRAVENOUS | Status: DC | PRN

## 2023-06-30 MED ORDER — MENTHOL 3 MG MT LOZG: 1.0000 | LOZENGE | OROMUCOSAL | Status: DC | PRN

## 2023-06-30 MED ORDER — ONDANSETRON HCL 4 MG/2ML IJ SOLN
INTRAMUSCULAR | Status: DC | PRN
  Administered 2023-06-30: 4 mg via INTRAVENOUS

## 2023-06-30 MED ORDER — POVIDONE-IODINE 10 % EX SWAB: 2.0000 | Freq: Once | CUTANEOUS | Status: DC

## 2023-06-30 MED ORDER — ONDANSETRON HCL 4 MG PO TABS: 4.0000 mg | ORAL_TABLET | Freq: Four times a day (QID) | ORAL | Status: DC | PRN

## 2023-06-30 MED ORDER — METOCLOPRAMIDE HCL 5 MG/ML IJ SOLN: 5.0000 mg | Freq: Three times a day (TID) | INTRAMUSCULAR | Status: DC | PRN

## 2023-06-30 MED ORDER — HYDROCHLOROTHIAZIDE 25 MG PO TABS
25.0000 mg | ORAL_TABLET | Freq: Every day | ORAL | Status: DC
  Administered 2023-06-30 – 2023-07-01 (×2): 25 mg via ORAL
  Filled 2023-06-30 (×2): qty 1

## 2023-06-30 MED ORDER — DEXAMETHASONE SODIUM PHOSPHATE 10 MG/ML IJ SOLN
8.0000 mg | Freq: Once | INTRAMUSCULAR | Status: AC
  Administered 2023-06-30: 8 mg via INTRAVENOUS

## 2023-06-30 MED ORDER — PHENOL 1.4 % MT LIQD: 1.0000 | OROMUCOSAL | Status: DC | PRN

## 2023-06-30 MED ORDER — HYDROCODONE-ACETAMINOPHEN 7.5-325 MG PO TABS
1.0000 | ORAL_TABLET | ORAL | Status: DC | PRN
  Administered 2023-06-30: 1 via ORAL
  Filled 2023-06-30: qty 1

## 2023-06-30 MED ORDER — SODIUM CHLORIDE 0.9 % IR SOLN
Status: DC | PRN
Start: 2023-06-30 — End: 2023-06-30
  Administered 2023-06-30: 3000 mL

## 2023-06-30 MED ORDER — METOPROLOL TARTRATE 50 MG PO TABS
100.0000 mg | ORAL_TABLET | Freq: Two times a day (BID) | ORAL | Status: DC
  Administered 2023-06-30 – 2023-07-01 (×2): 100 mg via ORAL
  Filled 2023-06-30 (×2): qty 2

## 2023-06-30 MED ORDER — ROCURONIUM BROMIDE 10 MG/ML (PF) SYRINGE
PREFILLED_SYRINGE | INTRAVENOUS | Status: AC
  Filled 2023-06-30: qty 10

## 2023-06-30 MED ORDER — POLYETHYLENE GLYCOL 3350 17 G PO PACK
17.0000 g | PACK | Freq: Two times a day (BID) | ORAL | Status: DC
  Filled 2023-06-30: qty 1

## 2023-06-30 MED ORDER — FENTANYL CITRATE PF 50 MCG/ML IJ SOSY
PREFILLED_SYRINGE | INTRAMUSCULAR | Status: AC
  Administered 2023-06-30: 50 ug via INTRAVENOUS
  Filled 2023-06-30: qty 2

## 2023-06-30 MED ORDER — ACETAMINOPHEN 10 MG/ML IV SOLN
1000.0000 mg | Freq: Once | INTRAVENOUS | Status: DC | PRN
  Administered 2023-06-30: 1000 mg via INTRAVENOUS

## 2023-06-30 MED ORDER — CEFAZOLIN SODIUM-DEXTROSE 2-4 GM/100ML-% IV SOLN
2.0000 g | INTRAVENOUS | Status: AC
  Administered 2023-06-30: 2 g via INTRAVENOUS
  Filled 2023-06-30: qty 100

## 2023-06-30 MED ORDER — FENTANYL CITRATE PF 50 MCG/ML IJ SOSY
PREFILLED_SYRINGE | INTRAMUSCULAR | Status: AC
  Administered 2023-06-30: 50 ug via INTRAVENOUS
  Filled 2023-06-30: qty 1

## 2023-06-30 MED ORDER — CHLORHEXIDINE GLUCONATE 0.12 % MT SOLN
15.0000 mL | Freq: Once | OROMUCOSAL | Status: AC
  Administered 2023-06-30: 15 mL via OROMUCOSAL

## 2023-06-30 MED ORDER — FENTANYL CITRATE PF 50 MCG/ML IJ SOSY
PREFILLED_SYRINGE | INTRAMUSCULAR | Status: AC
  Filled 2023-06-30: qty 1

## 2023-06-30 MED ORDER — ROSUVASTATIN CALCIUM 10 MG PO TABS: 10.0000 mg | ORAL_TABLET | ORAL | Status: DC

## 2023-06-30 MED ORDER — PROPOFOL 10 MG/ML IV BOLUS
INTRAVENOUS | Status: AC
  Filled 2023-06-30: qty 20

## 2023-06-30 MED ORDER — PROPOFOL 10 MG/ML IV BOLUS
INTRAVENOUS | Status: DC | PRN
Start: 2023-06-30 — End: 2023-06-30
  Administered 2023-06-30: 100 mg via INTRAVENOUS

## 2023-06-30 MED ORDER — DOCUSATE SODIUM 100 MG PO CAPS
100.0000 mg | ORAL_CAPSULE | Freq: Two times a day (BID) | ORAL | Status: DC
  Filled 2023-06-30: qty 1

## 2023-06-30 MED ORDER — CELECOXIB 200 MG PO CAPS
200.0000 mg | ORAL_CAPSULE | Freq: Two times a day (BID) | ORAL | Status: DC
  Administered 2023-06-30 – 2023-07-01 (×2): 200 mg via ORAL
  Filled 2023-06-30 (×2): qty 1

## 2023-06-30 MED ORDER — DIGESTIVE ADV DIGESTIVE/IMMUNE PO CHEW: 2.0000 | CHEWABLE_TABLET | Freq: Every day | ORAL | Status: DC

## 2023-06-30 MED ORDER — LACTATED RINGERS IV SOLN: INTRAVENOUS | Status: DC

## 2023-06-30 MED ORDER — FENTANYL CITRATE PF 50 MCG/ML IJ SOSY: 25.0000 ug | PREFILLED_SYRINGE | INTRAMUSCULAR | Status: DC | PRN

## 2023-06-30 MED ORDER — CALCIUM CARBONATE ANTACID 500 MG PO CHEW
500.0000 mg | CHEWABLE_TABLET | Freq: Every morning | ORAL | Status: DC
  Administered 2023-06-30 – 2023-07-01 (×2): 500 mg via ORAL
  Filled 2023-06-30 (×2): qty 3

## 2023-06-30 MED ORDER — PHENYLEPHRINE 80 MCG/ML (10ML) SYRINGE FOR IV PUSH (FOR BLOOD PRESSURE SUPPORT)
PREFILLED_SYRINGE | INTRAVENOUS | Status: AC
  Filled 2023-06-30: qty 10

## 2023-06-30 MED ORDER — 0.9 % SODIUM CHLORIDE (POUR BTL) OPTIME
TOPICAL | Status: DC | PRN
  Administered 2023-06-30: 1000 mL

## 2023-06-30 MED ORDER — ASPIRIN 81 MG PO TBEC
81.0000 mg | DELAYED_RELEASE_TABLET | Freq: Every morning | ORAL | Status: DC
  Administered 2023-07-01: 81 mg via ORAL
  Filled 2023-06-30: qty 1

## 2023-06-30 MED ORDER — SUGAMMADEX SODIUM 200 MG/2ML IV SOLN
INTRAVENOUS | Status: DC | PRN
  Administered 2023-06-30: 200 mg via INTRAVENOUS

## 2023-06-30 MED ORDER — ROCURONIUM BROMIDE 100 MG/10ML IV SOLN
INTRAVENOUS | Status: DC | PRN
  Administered 2023-06-30: 50 mg via INTRAVENOUS

## 2023-06-30 MED ORDER — TRANEXAMIC ACID-NACL 1000-0.7 MG/100ML-% IV SOLN
1000.0000 mg | INTRAVENOUS | Status: AC
  Administered 2023-06-30: 1000 mg via INTRAVENOUS
  Filled 2023-06-30: qty 100

## 2023-06-30 MED ORDER — FENTANYL CITRATE (PF) 100 MCG/2ML IJ SOLN
INTRAMUSCULAR | Status: AC
  Filled 2023-06-30: qty 2

## 2023-06-30 MED ORDER — CEFAZOLIN SODIUM-DEXTROSE 2-4 GM/100ML-% IV SOLN
2.0000 g | Freq: Four times a day (QID) | INTRAVENOUS | Status: AC
  Administered 2023-06-30 – 2023-07-01 (×2): 2 g via INTRAVENOUS
  Filled 2023-06-30 (×2): qty 100

## 2023-06-30 MED ORDER — BISACODYL 10 MG RE SUPP: 10.0000 mg | Freq: Every day | RECTAL | Status: DC | PRN

## 2023-06-30 MED ORDER — LISINOPRIL-HYDROCHLOROTHIAZIDE 20-25 MG PO TABS: 1.0000 | ORAL_TABLET | Freq: Every morning | ORAL | Status: DC

## 2023-06-30 MED ORDER — DEXAMETHASONE SODIUM PHOSPHATE 10 MG/ML IJ SOLN
INTRAMUSCULAR | Status: AC
  Filled 2023-06-30: qty 1

## 2023-06-30 MED ORDER — ACETAMINOPHEN 325 MG PO TABS: 325.0000 mg | ORAL_TABLET | Freq: Four times a day (QID) | ORAL | Status: DC | PRN

## 2023-06-30 MED ORDER — METHOCARBAMOL 1000 MG/10ML IJ SOLN: 500.0000 mg | Freq: Four times a day (QID) | INTRAVENOUS | Status: DC | PRN

## 2023-06-30 MED ORDER — DEXAMETHASONE SODIUM PHOSPHATE 10 MG/ML IJ SOLN
10.0000 mg | Freq: Once | INTRAMUSCULAR | Status: AC
  Administered 2023-07-01: 10 mg via INTRAVENOUS
  Filled 2023-06-30: qty 1

## 2023-06-30 MED ORDER — POTASSIUM CHLORIDE CRYS ER 10 MEQ PO TBCR
10.0000 meq | EXTENDED_RELEASE_TABLET | Freq: Two times a day (BID) | ORAL | Status: DC
  Administered 2023-06-30 – 2023-07-01 (×2): 10 meq via ORAL
  Filled 2023-06-30 (×4): qty 1

## 2023-06-30 MED ORDER — HYDROCODONE-ACETAMINOPHEN 5-325 MG PO TABS: 1.0000 | ORAL_TABLET | ORAL | Status: DC | PRN

## 2023-06-30 MED ORDER — METOCLOPRAMIDE HCL 5 MG PO TABS: 5.0000 mg | ORAL_TABLET | Freq: Three times a day (TID) | ORAL | Status: DC | PRN

## 2023-06-30 MED ORDER — ORAL CARE MOUTH RINSE: 15.0000 mL | Freq: Once | OROMUCOSAL | Status: AC

## 2023-06-30 MED ORDER — FENTANYL CITRATE (PF) 100 MCG/2ML IJ SOLN
INTRAMUSCULAR | Status: DC | PRN
  Administered 2023-06-30 (×3): 50 ug via INTRAVENOUS

## 2023-06-30 MED ORDER — ONDANSETRON HCL 4 MG/2ML IJ SOLN
INTRAMUSCULAR | Status: AC
  Filled 2023-06-30: qty 2

## 2023-06-30 MED ORDER — TRANEXAMIC ACID-NACL 1000-0.7 MG/100ML-% IV SOLN
1000.0000 mg | Freq: Once | INTRAVENOUS | Status: AC
  Administered 2023-06-30: 1000 mg via INTRAVENOUS
  Filled 2023-06-30: qty 100

## 2023-06-30 MED ORDER — ACETAMINOPHEN 10 MG/ML IV SOLN
INTRAVENOUS | Status: AC
  Filled 2023-06-30: qty 100

## 2023-06-30 SURGICAL SUPPLY — 55 items
ADH SKN CLS APL DERMABOND .7 (GAUZE/BANDAGES/DRESSINGS) ×1
BAG COUNTER SPONGE SURGICOUNT (BAG) IMPLANT
BAG SPEC THK2 15X12 ZIP CLS (MISCELLANEOUS) ×1
BAG SPNG CNTER NS LX DISP (BAG)
BAG ZIPLOCK 12X15 (MISCELLANEOUS) ×1 IMPLANT
BLADE OSCILLATING/SAGITTAL (BLADE)
BLADE SW THK.38XMED LNG THN (BLADE) IMPLANT
BNDG CMPR 6 X 5 YARDS HK CLSR (GAUZE/BANDAGES/DRESSINGS)
BNDG ELASTIC 6INX 5YD STR LF (GAUZE/BANDAGES/DRESSINGS) ×1 IMPLANT
COVER MAYO STAND STRL (DRAPES) IMPLANT
COVER SURGICAL LIGHT HANDLE (MISCELLANEOUS) ×1 IMPLANT
CUFF TOURN SGL QUICK 34 (TOURNIQUET CUFF)
CUFF TRNQT CYL 34X4.125X (TOURNIQUET CUFF) IMPLANT
DERMABOND ADVANCED .7 DNX12 (GAUZE/BANDAGES/DRESSINGS) ×1 IMPLANT
DRAPE ORTHO SPLIT 77X108 STRL (DRAPES) ×2
DRAPE POUCH INSTRU U-SHP 10X18 (DRAPES) ×1 IMPLANT
DRAPE SHEET LG 3/4 BI-LAMINATE (DRAPES) IMPLANT
DRAPE SURG ORHT 6 SPLT 77X108 (DRAPES) ×2 IMPLANT
DRAPE U-SHAPE 47X51 STRL (DRAPES) ×1 IMPLANT
DRSG AQUACEL AG ADV 3.5X 6 (GAUZE/BANDAGES/DRESSINGS) IMPLANT
DRSG EMULSION OIL 3X16 NADH (GAUZE/BANDAGES/DRESSINGS) ×1 IMPLANT
DURAPREP 26ML APPLICATOR (WOUND CARE) ×1 IMPLANT
ELECT REM PT RETURN 15FT ADLT (MISCELLANEOUS) ×1 IMPLANT
FACESHIELD WRAPAROUND (MASK) ×2
FACESHIELD WRAPAROUND OR TEAM (MASK) IMPLANT
GAUZE PAD ABD 8X10 STRL (GAUZE/BANDAGES/DRESSINGS) ×1 IMPLANT
GAUZE SPONGE 4X4 12PLY STRL (GAUZE/BANDAGES/DRESSINGS) ×1 IMPLANT
GLOVE BIO SURGEON STRL SZ 6 (GLOVE) ×1 IMPLANT
GLOVE BIOGEL PI IND STRL 6.5 (GLOVE) ×1 IMPLANT
GLOVE BIOGEL PI IND STRL 7.5 (GLOVE) ×3 IMPLANT
GLOVE ORTHO TXT STRL SZ7.5 (GLOVE) ×2 IMPLANT
GOWN STRL REUS W/ TWL LRG LVL3 (GOWN DISPOSABLE) ×2 IMPLANT
GOWN STRL REUS W/TWL LRG LVL3 (GOWN DISPOSABLE) ×2
HANDPIECE INTERPULSE COAX TIP (DISPOSABLE) ×1
KIT BASIN OR (CUSTOM PROCEDURE TRAY) IMPLANT
KIT TURNOVER KIT A (KITS) IMPLANT
MANIFOLD NEPTUNE II (INSTRUMENTS) ×1 IMPLANT
NS IRRIG 1000ML POUR BTL (IV SOLUTION) ×1 IMPLANT
PACK TOTAL JOINT (CUSTOM PROCEDURE TRAY) IMPLANT
PACK TOTAL KNEE CUSTOM (KITS) ×1 IMPLANT
PROTECTOR NERVE ULNAR (MISCELLANEOUS) ×1 IMPLANT
SET HNDPC FAN SPRY TIP SCT (DISPOSABLE) IMPLANT
SOLUTION IRRIG SURGIPHOR (IV SOLUTION) IMPLANT
SOLUTION PRONTOSAN WOUND 350ML (IRRIGATION / IRRIGATOR) IMPLANT
STAPLER VISISTAT 35W (STAPLE) IMPLANT
STRIP CLOSURE SKIN 1/2X4 (GAUZE/BANDAGES/DRESSINGS) ×2 IMPLANT
SUCTION TUBE FRAZIER 10FR DISP (SUCTIONS) ×1 IMPLANT
SUT MNCRL AB 4-0 PS2 18 (SUTURE) ×1 IMPLANT
SUT STRATAFIX PDS+ 0 24IN (SUTURE) ×1 IMPLANT
SUT VIC AB 1 CT1 36 (SUTURE) ×3 IMPLANT
SUT VIC AB 2-0 CT1 27 (SUTURE) ×1
SUT VIC AB 2-0 CT1 TAPERPNT 27 (SUTURE) ×1 IMPLANT
TOWEL OR 17X26 10 PK STRL BLUE (TOWEL DISPOSABLE) ×2 IMPLANT
TRAY FOLEY MTR SLVR 16FR STAT (SET/KITS/TRAYS/PACK) ×1 IMPLANT
WATER STERILE IRR 1000ML POUR (IV SOLUTION) ×1 IMPLANT

## 2023-06-30 NOTE — Op Note (Unsigned)
NAME: Janet Mcdonald, Janet Mcdonald MEDICAL RECORD NO: [id number] ACCOUNT NO: [id number] DATE OF BIRTH: August 21, 1946 FACILITY: [facility] LOCATION: [facility] PHYSICIAN: Madlyn Frankel. 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  PROCEDURE:  Excisional and non-excisional debridement of her right thigh.  Excisional debridement was carried out with a combination of a scalpel and a Bovie and involving a 6-8 cm incision including the skin, subcutaneous tissue down to bone including  muscle fascia.  We then did a non-excisional debridement using normal saline as well as an iodine based product called Surgiphor and Prontosan.  SURGEON:  Madlyn Frankel. Charlann Boxer, MD  ASSISTANT:  Rosalene Billings, PA-C.  Note, Ms. Domenic Schwab was present for the entirety of the case from preoperative positioning, perioperative management of the operative extremity, general facilitation of case and primary wound closure.  ANESTHESIA:  General.  BLOOD LOSS:  Minimal.  INDICATIONS FOR PROCEDURE:  The patient is a 77 year old female with a history of a right total hip replacement remotely.  She had a non-venomous spider bite she reports about 5-6 years ago.  Since that time, she has had problems with a wound in the  posterolateral aspect of her thigh that she has been placed on and off antibiotics over the years.  There is times the wound seals up and there is times it open up and drains.  She has had recent evaluation by CT scan, which indicated sinus tract that  and went down to her femur.  Since the time of her spider bite she also had a distal femur fracture was treated with open reduction and internal fixation with a lateral side plate.  Again, this wound was present prior to even this fracture.  We  discussed  attempting an excisional and non-excisional debridement.  We discussed the risk of persistent drainage.  We discussed the potential need to remove all hardware including the hip and the lateral plate if there was any concern for the bone.  Consent was  obtained for management of this.  DESCRIPTION OF PROCEDURE:  The patient was brought to the operative theater.  Once adequate anesthesia, preoperative antibiotics, Ancef administered, she was positioned into the left lateral decubitus position with the right hip up.  The right lower  extremity was then prepped and draped in sterile fashion.  Timeout was performed identifying the patient, planned procedure, and extremity.  We identified on her posterolateral thigh, the area of this chronic wound drainage site.  I then did an  elliptical incision about 6-8 cm in length, excising the skin in this area.  I then did a soft tissue dissection down to the posterior muscular fascia.  In this interval I did not see any purulence.  I did not see an obvious sinus tract or any  penetration or opening in the fascial layer.  I then incised this muscle fascial layer creating layers.  Bovie cautery was used to cauterize bleeding as necessary.  Further debridement was carried out as necessary.  I then elevated the muscle off the  intermuscular septum and fascia down to the bone.  When I got to the lateral aspect of the femur I was able to identify the proximal tip of her plate.  Again, as I was going down to this area there was no evidence of infection or purulence.  Following  the excisional debridement, we irrigated the lateral wound with normal saline pulse lavage with about a liter initially.  I then used iodine based Surgiphor product and allowed this to sit in the wound for 5 minutes.  We then reirrigated the hip with  normal saline solution.  Then, prior to closure, we used the Prontosan antimicrobial solution.  This was left in the wound as we closed the  fascial layer.  We used #1 Vicryl to reapproximate the fascia.  The remainder of wound was closed with 3-0 Vicryl  and a running Monocryl stitch.  The posterolateral thigh wound was dressed with Dermabond and Aquacel dressing.  The patient was then brought to the recovery room in stable condition, tolerating the procedure well.  Findings reviewed with her daughter.   We will plan to have her spend the night in the hospital receiving Ancef prior to discharge back on Augmentin.  We will see her back in the office in 2 weeks.  No restrictions in activity.   PUS D: 06/30/2023 2:45:40 pm T: 06/30/2023 3:01:00 pm  JOB: [id number]/ [id number]

## 2023-06-30 NOTE — Anesthesia Preprocedure Evaluation (Addendum)
Anesthesia Evaluation  Patient identified by MRN, date of birth, ID band Patient awake    Reviewed: Allergy & Precautions, NPO status , Patient's Chart, lab work & pertinent test results  Airway Mallampati: II  TM Distance: >3 FB Neck ROM: Full    Dental no notable dental hx.    Pulmonary neg pulmonary ROS   Pulmonary exam normal        Cardiovascular hypertension, Pt. on medications and Pt. on home beta blockers  Rhythm:Regular Rate:Normal     Neuro/Psych negative neurological ROS  negative psych ROS   GI/Hepatic negative GI ROS, Neg liver ROS,,,  Endo/Other  negative endocrine ROS    Renal/GU negative Renal ROS  negative genitourinary   Musculoskeletal  (+) Arthritis , Osteoarthritis,    Abdominal Normal abdominal exam  (+)   Peds  Hematology Lab Results      Component                Value               Date                      WBC                      5.2                 06/16/2023                HGB                      13.9                06/16/2023                HCT                      41.7                06/16/2023                MCV                      84.9                06/16/2023                PLT                      178                 06/16/2023              Anesthesia Other Findings   Reproductive/Obstetrics                             Anesthesia Physical Anesthesia Plan  ASA: 2  Anesthesia Plan: General   Post-op Pain Management:    Induction: Intravenous  PONV Risk Score and Plan: 3 and Ondansetron, Dexamethasone and Treatment may vary due to age or medical condition  Airway Management Planned: Mask and Oral ETT  Additional Equipment: None  Intra-op Plan:   Post-operative Plan: Extubation in OR  Informed Consent: I have reviewed the patients History and Physical, chart, labs and discussed the procedure including the risks, benefits and alternatives for  the proposed anesthesia with  the patient or authorized representative who has indicated his/her understanding and acceptance.     Dental advisory given  Plan Discussed with: CRNA  Anesthesia Plan Comments:        Anesthesia Quick Evaluation

## 2023-06-30 NOTE — Brief Op Note (Signed)
06/30/2023  2:26 PM  PATIENT:  Janet Mcdonald  77 y.o. 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  PROCEDURE:  Procedure(s): Excision of right thigh around sinus tract (Right)  Debridement type: Excisional Debridement  Side: right  Body Location: posterior lateral thigh   Tools used for debridement: scalpel, bovie  Pre-debridement Wound size (cm):   Length: 1-2        Width: 1-2     Depth: unknown   Post-debridement Wound size (cm):   Length: 8        Width: 3     Depth: 6 cm down to her femur   Debridement depth beyond dead/damaged tissue down to healthy viable tissue: yes  Tissue layer involved: skin, skin, subcutaneous tissue, and skin, subcutaneous tissue, muscle / fascia  Nature of tissue removed: Non-viable tissue  Irrigation volume: 1500 cc NS, 50 cc of Protosan, 50 cc of Surgiphor (betadine solution)     Irrigation fluid type: Normal Saline, Protosan, Surgiphor     SURGEON:  Surgeons and Role:    Durene Romans, MD - Primary  PHYSICIAN ASSISTANT: Rosalene Billings, PA-C  ANESTHESIA:   general  EBL:  10 mL   BLOOD ADMINISTERED:none  DRAINS: none   LOCAL MEDICATIONS USED:  NONE  SPECIMEN:  No Specimen  DISPOSITION OF SPECIMEN:  N/A  COUNTS:  YES  TOURNIQUET:  * Missing tourniquet times found for documented tourniquets in log: [id number] *  DICTATION: .Other Dictation: Dictation Number [id number]  PLAN OF CARE: Admit for overnight observation  PATIENT DISPOSITION:  PACU - hemodynamically stable.   Delay start of Pharmacological VTE agent (>24hrs) due to surgical blood loss or risk of bleeding: not applicable

## 2023-06-30 NOTE — Transfer of Care (Signed)
Immediate Anesthesia Transfer of Care Note  Patient: Janet Mcdonald  Procedure(s) Performed: Excision of right thigh around sinus tract (Right: Thigh)  Patient Location: PACU  Anesthesia Type:General  Level of Consciousness: oriented, drowsy, and patient cooperative  Airway & Oxygen Therapy: Patient Spontanous Breathing and Patient connected to face mask oxygen  Post-op Assessment: Report given to RN and Post -op Vital signs reviewed and stable  Post vital signs: Reviewed and stable  Last Vitals:  Vitals Value Taken Time  BP 160/94 06/30/23 1443  Temp    Pulse 68 06/30/23 1441  Resp 6 06/30/23 1441  SpO2 100 % 06/30/23 1441  Vitals shown include unfiled device data.  Last Pain:  Vitals:   06/30/23 1047  TempSrc: Oral         Complications: No notable events documented.

## 2023-06-30 NOTE — Discharge Instructions (Signed)

## 2023-06-30 NOTE — H&P (Signed)
HIP ADMISSION H&P  Patient is admitted for excision of right thigh   Therapy Plans: HEP Disposition: Home with daughter Planned DVT Prophylaxis: aspirin 81mg  BID DME needed: none PCP: Dr. Tracie Harrier, clearance received TXA: IV Allergies: dalbavancin - went to ER, oxycodone - mood change - makes her mean Anesthesia Concerns: none BMI: 24 Last HgbA1c: Not diabetic   Other: - on Augmentin chronically - hydrocodone, robaxin, tylenol - No hx of VTE or cancer  Subjective:  Chief Complaint: right hip pain, chronic wound  HPI: Janet Mcdonald, 77 y.o. female. She has a history of total hip arthroplasty by Dr. Darrelyn Hillock in the early 2000's. She subsequently broke her femur and had an ORIF with a plate. Since then, she has dealt with a chronic wound and drainage.   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   Femur fracture, right (HCC) 12/28/2019   Past Medical History:  Diagnosis Date   Arthritis    lower back is "very bad"   Hypertension    MRSA (methicillin resistant staph aureus) culture positive    Pre-diabetes    lost 81 lbs    Past Surgical History:  Procedure Laterality Date   ABDOMINAL HYSTERECTOMY     APPENDECTOMY     Bilateral hip replacment     CARPAL TUNNEL RELEASE Right    EYE SURGERY     cataracts with lens implant   IR 3D INDEPENDENT WKST  05/09/2023   IR SINUS/FIST TUBE CHK-NON GI  06/26/2017   IR SINUS/FIST TUBE CHK-NON GI  05/09/2023   ORIF FEMUR FRACTURE Right 12/28/2019   Procedure: OPEN REDUCTION INTERNAL FIXATION (ORIF) DISTAL FEMUR FRACTURE;  Surgeon: Toni Arthurs, MD;  Location: [facility];  Service: Orthopedics;  Laterality: Right;    Current Facility-Administered Medications  Medication Dose Route Frequency Provider Last Rate Last Admin   ceFAZolin (ANCEF) IVPB 2g/100 mL premix  2 g Intravenous On Call to  OR Janet Anger, PA-C       dexamethasone (DECADRON) injection 8 mg  8 mg Intravenous Once Janet Anger, PA-C       lactated ringers infusion   Intravenous Continuous Janet Anger, PA-C 75 mL/hr at 06/30/23 1117 New Bag at 06/30/23 1117   lactated ringers infusion   Intravenous Continuous Linton Rump, MD       povidone-iodine 10 % swab 2 Application  2 Application Topical Once Janet Anger, PA-C       tranexamic acid (CYKLOKAPRON) IVPB 1,000 mg  1,000 mg Intravenous To OR Janet Anger, PA-C       Allergies  Allergen Reactions   Dalvance [Dalbavancin] Shortness Of Breath and Other (See Comments)    Shortness of breath, chest pain, and back pain    Oxycodone Other (See Comments)    Psychosis   Tetracyclines & Related Nausea And Vomiting    Social History   Tobacco Use   Smoking status: Never   Smokeless tobacco: Never  Substance Use Topics   Alcohol use: No    History reviewed. No pertinent family history.   Review of Systems  Constitutional:  Negative for chills and fever.  Respiratory:  Negative for cough and shortness of breath.   Cardiovascular:  Negative for chest pain.  Gastrointestinal:  Negative for nausea and vomiting.  Musculoskeletal:  Positive for arthralgias.     Objective:  Physical Exam Well nourished and well developed. General: Alert and oriented x3, cooperative and pleasant, no acute distress. Head: normocephalic, atraumatic, neck supple. Eyes: EOMI.  Musculoskeletal:  Today, no drainage or opening, prior examination from 7/11 as below:  Right thigh exam: On the posterior lateral aspect of her right thigh she has a 2 to 4 mm opening with serous type drainage present. No active purulence, no foul odor. No palpable fluctuance No surrounding erythema or warmth This small lesion is posterior to any incisions that related to her right hip or distal femur fixation. It is noted that her history of the spider bite was 5  years ago and obviously prior to the open reduction internal fixation of her distal femur but after her total hip replacement.  Calves soft and nontender. Motor function intact in LE. Strength 5/5 LE bilaterally. Neuro: Distal pulses 2+. Sensation to light touch intact in LE.  Vital signs in last 24 hours: Temp:  [98 F (36.7 C)] 98 F (36.7 C) (08/26 1047) Pulse Rate:  [71] 71 (08/26 1047) Resp:  [11] 11 (08/26 1047) BP: (162)/(97) 162/97 (08/26 1047) SpO2:  [99 %] 99 % (08/26 1047) Weight:  [63.1 kg] 63.1 kg (08/26 1054)  Labs:   Estimated body mass index is 23.89 kg/m as calculated from the following:   Height as of this encounter: 5\' 4"  (1.626 m).   Weight as of this encounter: 63.1 kg.   Imaging Review  Imaging: AP and lateral of the right hip and femur were ordered and reviewed. She has a prior right hip arthroplasty with stable appearing components. There is not appear to be obvious loosening or concerns. Additionally she had a distal femur fracture treated with an open reduction internal fixation with retained lateral based plate.     Assessment/Plan:  Chronic wound, right thigh   The patient history, physical examination, clinical judgement of the provider and imaging studies are consistent with chronic wound of the right hip(s) and excision is deemed medically necessary. The treatment options including medical management, surgical excision, excision of hardware were discussed at length. The risks and benefits of excision were presented and reviewed. The risks due to aseptic loosening, infection, stiffness, dislocation/subluxation,  thromboembolic complications and other imponderables were discussed.  The patient acknowledged the explanation, agreed to proceed with the plan and consent was signed. Patient is being admitted for inpatient treatment for surgery, pain control, PT, OT, prophylactic antibiotics, VTE prophylaxis, progressive ambulation and ADL's and discharge  planning.The patient is planning to be discharged  home.   Janet Billings, PA-C Orthopedic Surgery [facility] [phone]

## 2023-06-30 NOTE — Plan of Care (Signed)
  Problem: Education: Goal: Knowledge of the prescribed therapeutic regimen will improve Outcome: Progressing   Problem: Activity: Goal: Ability to avoid complications of mobility impairment will improve Outcome: Progressing   Problem: Pain Management: Goal: Pain level will decrease with appropriate interventions Outcome: Progressing   Problem: Activity: Goal: Ability to avoid complications of mobility impairment will improve Outcome: Progressing

## 2023-06-30 NOTE — Anesthesia Postprocedure Evaluation (Signed)
Anesthesia Post Note  Patient: Janet Mcdonald  Procedure(s) Performed: Excision of right thigh around sinus tract (Right: Thigh)     Patient location during evaluation: PACU Anesthesia Type: General Level of consciousness: awake and alert Pain management: pain level controlled Vital Signs Assessment: post-procedure vital signs reviewed and stable Respiratory status: spontaneous breathing, nonlabored ventilation, respiratory function stable and patient connected to nasal cannula oxygen Cardiovascular status: blood pressure returned to baseline and stable Postop Assessment: no apparent nausea or vomiting Anesthetic complications: no   No notable events documented.  Last Vitals:  Vitals:   06/30/23 1630 06/30/23 1657  BP: (!) 169/92 (!) 176/89  Pulse: 64 66  Resp: (!) 22 20  Temp: (!) 36.4 C 36.7 C  SpO2: 96% 95%    Last Pain:  Vitals:   06/30/23 1657  TempSrc: Oral  PainSc: 5                  Suzannah Bettes P [name]

## 2023-06-30 NOTE — Anesthesia Procedure Notes (Signed)
Procedure Name: Intubation Date/Time: 06/30/2023 1:22 PM  Performed by: Ahmed Prima, CRNAPre-anesthesia Checklist: Patient identified, Emergency Drugs available, Suction available and Patient being monitored Patient Re-evaluated:Patient Re-evaluated prior to induction Oxygen Delivery Method: Circle System Utilized Preoxygenation: Pre-oxygenation with 100% oxygen Induction Type: IV induction Ventilation: Mask ventilation without difficulty Laryngoscope Size: Miller and 2 Grade View: Grade I Tube type: Oral Tube size: 7.0 mm Number of attempts: 1 Airway Equipment and Method: Stylet and Oral airway Placement Confirmation: ETT inserted through vocal cords under direct vision, positive ETCO2 and breath sounds checked- equal and bilateral Secured at: 21 cm Tube secured with: Tape Dental Injury: Teeth and Oropharynx as per pre-operative assessment

## 2023-06-30 NOTE — Interval H&P Note (Signed)
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  The various methods of treatment have been discussed with the patient and family. After consideration of risks, benefits and other options for treatment, the patient has consented to  Procedure(s): Excision of right thigh around sinus tract w/ possible removal of hardware right femur (Right) as a surgical intervention.  The patient's history has been reviewed, patient examined, no change in status, stable for surgery.  I have reviewed the patient's chart and labs.  Questions were answered to the patient's satisfaction.     Shelda Pal

## 2023-07-01 ENCOUNTER — Encounter (HOSPITAL_COMMUNITY): Payer: Self-pay | Admitting: Orthopedic Surgery

## 2023-07-01 DIAGNOSIS — W57XXXA Bitten or stung by nonvenomous insect and other nonvenomous arthropods, initial encounter: Secondary | ICD-10-CM | POA: Diagnosis not present

## 2023-07-01 DIAGNOSIS — S70361S Insect bite (nonvenomous), right thigh, sequela: Secondary | ICD-10-CM | POA: Diagnosis not present

## 2023-07-01 DIAGNOSIS — I10 Essential (primary) hypertension: Secondary | ICD-10-CM | POA: Diagnosis not present

## 2023-07-01 DIAGNOSIS — R2689 Other abnormalities of gait and mobility: Secondary | ICD-10-CM | POA: Diagnosis not present

## 2023-07-01 LAB — BASIC METABOLIC PANEL
Anion gap: 8 (ref 5–15)
BUN: 12 mg/dL (ref 8–23)
CO2: 26 mmol/L (ref 22–32)
Calcium: 8.5 mg/dL — ABNORMAL LOW (ref 8.9–10.3)
Chloride: 100 mmol/L (ref 98–111)
Creatinine, Ser: 0.71 mg/dL (ref 0.44–1.00)
GFR, Estimated: >60 mL/min (ref >60)
Glucose, Bld: 151 mg/dL — ABNORMAL HIGH (ref 70–99)
Potassium: 3.2 mmol/L — ABNORMAL LOW (ref 3.5–5.1)
Sodium: 134 mmol/L — ABNORMAL LOW (ref 135–145)

## 2023-07-01 LAB — CBC
HCT: 37.5 % (ref 36.0–46.0)
Hemoglobin: 12.9 g/dL (ref 12.0–15.0)
MCH: 29.2 pg (ref 26.0–34.0)
MCHC: 34.4 g/dL (ref 30.0–36.0)
MCV: 84.8 fL (ref 80.0–100.0)
Platelets: 170 10*3/uL (ref 150–400)
RBC: 4.42 MIL/uL (ref 3.87–5.11)
RDW: 13 % (ref 11.5–15.5)
WBC: 8.5 10*3/uL (ref 4.0–10.5)
nRBC: 0 % (ref 0.0–0.2)

## 2023-07-01 NOTE — Progress Notes (Signed)
Patient ID: Janet Mcdonald, female   DOB: 1946/04/03, 77 y.o.   MRN: [id number] Subjective: 1 Day Post-Op Procedure(s) (LRB): Excision of right thigh around sinus tract (Right)    Patient reports pain as mild. No events over night Not much pain at all  Objective:   VITALS:   Vitals:   07/01/23 0117 07/01/23 0544  BP: 127/70 136/80  Pulse: 83 76  Resp: 16 16  Temp: 98 F (36.7 C) 98.5 F (36.9 C)  SpO2: 99% 99%    Neurovascular intact Incision: dressing C/D/I  LABS Recent Labs    07/01/23 0338  HGB 12.9  HCT 37.5  WBC 8.5  PLT 170    Recent Labs    07/01/23 0338  NA 134*  K 3.2*  BUN 12  CREATININE 0.71  GLUCOSE 151*    No results for input(s): "LABPT", "INR" in the last 72 hours.   Assessment/Plan: 1 Day Post-Op Procedure(s) (LRB): Excision of right thigh around sinus tract (Right)   Up with therapy Home today with Augmentin RTC in 2 weeks Activity as tolerated without restriction

## 2023-07-01 NOTE — Evaluation (Signed)
Physical Therapy Evaluation Patient Details Name: Janet Mcdonald MRN: [id number] DOB: 07-08-1946 Today's Date: 07/01/2023  History of Present Illness  77 yo  female chronic hip wound and drainage d/t remote insect bite. s/p Excision of right thigh around sinus tract on 06/30/23. 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 All education has been completed and the patient has no further questions.  Pt feels she is at her baseline, pain is controlled; amb ~ 360' with no device, no LOB; wide compensatory BOS which is normal mobility per pt; Pt is very pleasant, cooperative; motivated to d/c home. No further PT needs at this time   See below for any follow-up Physical Therapy or equipment needs. PT is signing off. Thank you for this referral.         If plan is discharge home, recommend the following: Help with stairs or ramp for entrance   Can travel by private vehicle        Equipment Recommendations None recommended by PT  Recommendations for Other Services       Functional Status Assessment Patient has not had a recent decline in their functional status     Precautions / Restrictions Precautions Precautions: Fall Restrictions Weight Bearing Restrictions: No Other Position/Activity Restrictions: WBAT      Mobility  Bed Mobility               General bed mobility comments: pt up in bathroom on arrival, NT in room    Transfers Overall transfer level: Modified independent   Transfers: Sit to/from Stand Sit to Stand: Modified independent (Device/Increase time)           General transfer comment: no device, no LOB    Ambulation/Gait Ambulation/Gait assistance: Supervision Gait Distance (Feet): 360 Feet Assistive device: None Gait Pattern/deviations: Step-through pattern, Decreased stride  length, Wide base of support       General Gait Details: wide compensatory BOS; no  LOB  Stairs            Wheelchair Mobility     Tilt Bed    Modified Rankin (Stroke Patients Only)       Balance Overall balance assessment: Mild deficits observed, not formally tested                                           Pertinent Vitals/Pain Pain Assessment Pain Assessment: No/denies pain    Home Living Family/patient expects to be discharged to:: Private residence Living Arrangements: Alone Available Help at Discharge: Family Type of Home: House Home Access: Stairs to enter   Secretary/administrator of Steps: 2 or ramp   Home Layout: Able to live on main level with bedroom/bathroom Home Equipment: Agricultural consultant (2 wheels)      Prior Function Prior Level of Function : Independent/Modified Independent                     Extremity/Trunk Assessment   Upper Extremity Assessment Upper Extremity Assessment: Overall WFL for tasks assessed    Lower Extremity Assessment Lower Extremity Assessment: Overall WFL for tasks assessed       Communication   Communication Communication: No apparent difficulties  Cognition Arousal: Alert Behavior During Therapy:  WFL for tasks assessed/performed Overall Cognitive Status: Within Functional Limits for tasks assessed                                          General Comments      Exercises     Assessment/Plan    PT Assessment Patient does not need any further PT services  PT Problem List         PT Treatment Interventions      PT Goals (Current goals can be found in the Care Plan section)  Acute Rehab PT Goals PT Goal Formulation: All assessment and education complete, DC therapy    Frequency       Co-evaluation               AM-PAC PT "6 Clicks" Mobility  Outcome Measure Help needed turning from your back to your side while in a flat bed without using  bedrails?: None Help needed moving from lying on your back to sitting on the side of a flat bed without using bedrails?: None Help needed moving to and from a bed to a chair (including a wheelchair)?: None Help needed standing up from a chair using your arms (e.g., wheelchair or bedside chair)?: None Help needed to walk in hospital room?: None Help needed climbing 3-5 steps with a railing? : None 6 Click Score: 24    End of Session Equipment Utilized During Treatment: Gait belt Activity Tolerance: Patient tolerated treatment well Patient left: in bed;with call bell/phone within reach Nurse Communication: Mobility status PT Visit Diagnosis: Other abnormalities of gait and mobility (R26.89)    Time: 1051-1102 PT Time Calculation (min) (ACUTE ONLY): 11 min   Charges:   PT Evaluation $PT Eval Low Complexity: 1 Low   PT General Charges $$ ACUTE PT VISIT: 1 Visit         Saprina Chuong, PT  Acute Rehab Dept [facility]) [phone]  07/01/2023   [facility] 07/01/2023, 11:14 AM

## 2023-07-01 NOTE — Progress Notes (Signed)
   07/01/23 1252  TOC Brief Assessment  Insurance and Status Reviewed  Patient has primary care physician Yes  Home environment has been reviewed home with daughter to assist as needed  Prior level of function: independent  Prior/Current Home Services No current home services  Social Determinants of Health Reivew SDOH reviewed no interventions necessary  Readmission risk has been reviewed Yes  Transition of care needs no transition of care needs at this time   Met with pt who reports she feels very ready for discharge.  Independent with mobility.  No follow up needed.  Daughter to provide dc transportation.

## 2023-07-02 LAB — TYPE AND SCREEN
ABO/RH(D): O POS
Antibody Screen: POSITIVE
Donor AG Type: NEGATIVE
Unit division: 0

## 2023-07-02 LAB — BPAM RBC
Blood Product Expiration Date: 202409062359
Unit Type and Rh: 5100

## 2023-07-03 NOTE — Discharge Summary (Signed)
Patient ID: ZALEY MATSUSHITA MRN: [id number] DOB/AGE: 05-Mar-1946 77 y.o.  Admit date: 06/30/2023 Discharge date: 07/01/2023  Admission Diagnoses:  Chronic intermittent draining sinus from her right posterior thigh following an insect bite.   Discharge Diagnoses:  Principal Problem:   Complicated open wound of hip, right, sequela   Past Medical History:  Diagnosis Date   Arthritis    lower back is "very bad"   Hypertension    MRSA (methicillin resistant staph aureus) culture positive    Pre-diabetes    lost 81 lbs    Surgeries: Procedure(s): Excision of right thigh around sinus tract on 06/30/2023   Consultants:   Discharged Condition: Improved  Hospital Course: AZIYAH METOXEN is an 77 y.o. female who was admitted 06/30/2023 for operative treatment ofComplicated open wound of hip, right, sequela. Patient has severe unremitting pain that affects sleep, daily activities, and work/hobbies. After pre-op clearance the patient was taken to the operating room on 06/30/2023 and underwent  Procedure(s): Excision of right thigh around sinus tract.    Patient was given perioperative antibiotics:  Anti-infectives (From admission, onward)    Start     Dose/Rate Route Frequency Ordered Stop   06/30/23 1800  ceFAZolin (ANCEF) IVPB 2g/100 mL premix        2 g 200 mL/hr over 30 Minutes Intravenous Every 6 hours 06/30/23 1655 07/01/23 0145   06/30/23 1045  ceFAZolin (ANCEF) IVPB 2g/100 mL premix        2 g 200 mL/hr over 30 Minutes Intravenous On call to O.R. 06/30/23 1036 06/30/23 1335        Patient was given sequential compression devices, early ambulation, and chemoprophylaxis to prevent DVT. Patient worked with PT and was meeting their goals regarding safe ambulation and transfers.  Patient benefited maximally from hospital stay and there were no complications.    Recent vital signs: No data found.   Recent laboratory studies:  Recent Labs    07/01/23 0338  WBC 8.5  HGB  12.9  HCT 37.5  PLT 170  NA 134*  K 3.2*  CL 100  CO2 26  BUN 12  CREATININE 0.71  GLUCOSE 151*  CALCIUM 8.5*     Discharge Medications:   Allergies as of 07/01/2023       Reactions   Dalvance [dalbavancin] Shortness Of Breath, Other (See Comments)   Shortness of breath, chest pain, and back pain    Oxycodone Other (See Comments)   Psychosis   Tetracyclines & Related Nausea And Vomiting        Medication List     STOP taking these medications    aspirin EC 81 MG tablet   doxycycline 100 MG tablet Commonly known as: VIBRA-TABS   naproxen sodium 220 MG tablet Commonly known as: ALEVE       TAKE these medications    ADULT GUMMY PO Take 2 tablets by mouth at bedtime.   amoxicillin-clavulanate 875-125 MG tablet Commonly known as: AUGMENTIN Take 1 tablet by mouth in the morning.   calcium carbonate 500 MG chewable tablet Commonly known as: TUMS - dosed in mg elemental calcium Chew 500 mg by mouth in the morning.   Digestive Adv Digestive/Immune Chew Chew 2 tablets by mouth at bedtime.   lisinopril-hydrochlorothiazide 20-25 MG tablet Commonly known as: ZESTORETIC Take 1 tablet by mouth every morning.   metoprolol tartrate 100 MG tablet Commonly known as: LOPRESSOR Take 100 mg by mouth 2 (two) times daily.   potassium chloride 10 MEQ tablet  Commonly known as: KLOR-CON Take 10 mEq by mouth 2 (two) times daily.   rosuvastatin 10 MG tablet Commonly known as: CRESTOR Take 10 mg by mouth 2 (two) times a week. Sundays & Wednesdays in the morning.               Discharge Care Instructions  (From admission, onward)           Start     Ordered   07/01/23 0000  Change dressing       Comments: Maintain surgical dressing until follow up in the clinic. If the edges start to pull up, may reinforce with tape. If the dressing is no longer working, may remove and cover with gauze and tape, but must keep the area dry and clean.  Call with any questions  or concerns.   07/01/23 1251            Diagnostic Studies: No results found.  Disposition: Discharge disposition: 01-Home or Self Care       Discharge Instructions     Call MD / Call 911   Complete by: As directed    If you experience chest pain or shortness of breath, CALL 911 and be transported to the hospital emergency room.  If you develope a fever above 101 F, pus (white drainage) or increased drainage or redness at the wound, or calf pain, call your surgeon's office.   Change dressing   Complete by: As directed    Maintain surgical dressing until follow up in the clinic. If the edges start to pull up, may reinforce with tape. If the dressing is no longer working, may remove and cover with gauze and tape, but must keep the area dry and clean.  Call with any questions or concerns.   Constipation Prevention   Complete by: As directed    Drink plenty of fluids.  Prune juice may be helpful.  You may use a stool softener, such as Colace (over the counter) 100 mg twice a day.  Use MiraLax (over the counter) for constipation as needed.   Diet - low sodium heart healthy   Complete by: As directed    Increase activity slowly as tolerated   Complete by: As directed    Weight bearing as tolerated with assist device (walker, cane, etc) as directed, use it as long as suggested by your surgeon or therapist, typically at least 4-6 weeks.   Post-operative opioid taper instructions:   Complete by: As directed    POST-OPERATIVE OPIOID TAPER INSTRUCTIONS: It is important to wean off of your opioid medication as soon as possible. If you do not need pain medication after your surgery it is ok to stop day one. Opioids include: Codeine, Hydrocodone(Norco, Vicodin), Oxycodone(Percocet, oxycontin) and hydromorphone amongst others.  Long term and even short term use of opiods can cause: Increased pain response Dependence Constipation Depression Respiratory depression And more.  Withdrawal  symptoms can include Flu like symptoms Nausea, vomiting And more Techniques to manage these symptoms Hydrate well Eat regular healthy meals Stay active Use relaxation techniques(deep breathing, meditating, yoga) Do Not substitute Alcohol to help with tapering If you have been on opioids for less than two weeks and do not have pain than it is ok to stop all together.  Plan to wean off of opioids This plan should start within one week post op of your joint replacement. Maintain the same interval or time between taking each dose and first decrease the dose.  Cut the total  daily intake of opioids by one tablet each day Next start to increase the time between doses. The last dose that should be eliminated is the evening dose.      TED hose   Complete by: As directed    Use stockings (TED hose) for 2 weeks on both leg(s).  You may remove them at night for sleeping.        Follow-up Information     Durene Romans, MD. Schedule an appointment as soon as possible for a visit in 2 week(s).   Specialty: Orthopedic Surgery Contact information: [address] [place] [address] [phone]                  Signed: Cassandria Anger 07/03/2023, 7:22 AM

## 2023-07-21 ENCOUNTER — Telehealth: Payer: Self-pay

## 2023-07-21 NOTE — Telephone Encounter (Signed)
Called [facility] and requested culture results from 8/26 surgery be faxed to triage.   Sandie Ano, RN

## 2023-07-22 ENCOUNTER — Ambulatory Visit: Payer: Medicare HMO | Admitting: Internal Medicine

## 2023-07-22 ENCOUNTER — Encounter: Payer: Self-pay | Admitting: Internal Medicine

## 2023-07-22 ENCOUNTER — Other Ambulatory Visit: Payer: Self-pay

## 2023-07-22 VITALS — BP 167/89 | HR 71 | Temp 97.8°F | Ht 64.0 in | Wt 144.0 lb

## 2023-07-22 DIAGNOSIS — M86651 Other chronic osteomyelitis, right thigh: Secondary | ICD-10-CM | POA: Diagnosis not present

## 2023-07-22 DIAGNOSIS — S71001S Unspecified open wound, right hip, sequela: Secondary | ICD-10-CM

## 2023-07-22 DIAGNOSIS — S71001D Unspecified open wound, right hip, subsequent encounter: Secondary | ICD-10-CM | POA: Diagnosis not present

## 2023-07-22 NOTE — Progress Notes (Unsigned)
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 She underwent surgery on 8/26 to debride sinus tract. Appeared clean no purulence in OR note. Was discharged to continue on amox/clav. Continues to tolerate abtx without difficulty Outpatient Encounter Medications as of 07/22/2023  Medication Sig   amoxicillin-clavulanate (AUGMENTIN) 875-125 MG tablet Take 1 tablet by mouth in the morning.   calcium carbonate (TUMS - DOSED IN MG ELEMENTAL CALCIUM) 500 MG chewable tablet Chew 500 mg by mouth in the morning.   celecoxib (CELEBREX) 200 MG capsule Take by mouth 2 (two) times daily.   lisinopril-hydrochlorothiazide (PRINZIDE,ZESTORETIC) 20-25 MG per tablet Take 1 tablet by mouth every morning.   metoprolol (LOPRESSOR) 100 MG tablet Take 100 mg by mouth 2 (two) times daily.   Multiple Vitamins-Minerals (ADULT GUMMY PO) Take 2 tablets by mouth at bedtime.   potassium chloride (KLOR-CON) 10 MEQ tablet Take 10 mEq by mouth 2 (two) times daily.   Probiotic Product (DIGESTIVE ADV DIGESTIVE/IMMUNE) CHEW Chew 2 tablets by mouth at bedtime.   rosuvastatin (CRESTOR) 10 MG tablet Take 10 mg by mouth 2 (two) times a week. Sundays & Wednesdays in the morning.   No facility-administered encounter medications on file as of 07/22/2023.     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  HENT: Negative for congestion, sore throat, rhinorrhea, sneezing, trouble swallowing and sinus pressure.  Eyes: Negative for photophobia and visual disturbance.  Respiratory: Negative for cough, chest tightness, shortness of breath, wheezing and stridor.  Cardiovascular: Negative for chest pain, palpitations and leg swelling.  Gastrointestinal: Negative for nausea, vomiting, abdominal pain, diarrhea, constipation, blood in stool, abdominal distention and anal bleeding.  Genitourinary: Negative for dysuria, hematuria, flank pain and difficulty urinating.  Musculoskeletal: Negative for myalgias, back pain, joint swelling, arthralgias and gait problem.  Skin: Negative for color change, pallor, rash and wound.  Neurological: Negative for dizziness, tremors, weakness and light-headedness.  Hematological: Negative for adenopathy. Does not bruise/bleed easily.  Psychiatric/Behavioral: Negative for behavioral problems, confusion, sleep disturbance, dysphoric mood, decreased concentration and agitation.   Physical Exam   BP (!) 167/89   Pulse 71   Temp 97.8 F (36.6 C) (Temporal)   Ht 5\' 4"  (1.626 m)   Wt 144 lb (65.3 kg)   SpO2 100%   BMI 24.72 kg/m   Physical Exam  Constitutional:  oriented to person, place, and time.  appears well-developed and well-nourished. No distress.  HENT: [place]/AT, PERRLA, no scleral icterus Mouth/Throat: Oropharynx is clear and moist. No oropharyngeal exudate.  Cardiovascular: Normal rate, regular rhythm and normal heart sounds. Exam reveals no gallop and no friction rub.   No murmur heard.  Lymphadenopathy: no cervical adenopathy. No axillary adenopathy Neurological: alert and oriented to person, place, and time.  Skin: Skin is warm and dry. No rash noted. No erythema. Right hip =Small fluctuance by incision no erythema or tenderness (likely seroma) no drainage. Healing appropriately Psychiatric: a normal mood and affect.  behavior is normal.    CBC Lab Results  Component Value Date   WBC 8.5 07/01/2023   RBC 4.42 07/01/2023   HGB 12.9 07/01/2023   HCT 37.5 07/01/2023   PLT 170 07/01/2023   MCV 84.8 07/01/2023   MCH 29.2 07/01/2023   MCHC 34.4 07/01/2023   RDW 13.0 07/01/2023   LYMPHSABS 1,810 03/20/2023   MONOABS 0.5 12/28/2019   EOSABS 218 03/20/2023    BMET Lab Results  Component Value Date   NA 134 (L) 07/01/2023   K 3.2 (L) 07/01/2023   CL 100 07/01/2023   CO2 26 07/01/2023   GLUCOSE 151 (H) 07/01/2023   BUN 12 07/01/2023   CREATININE 0.71 07/01/2023   CALCIUM 8.5 (L) 07/01/2023   GFRNONAA >60 07/01/2023   GFRAA >60 12/31/2019    Lab Results  Component Value Date   ESRSEDRATE 14 03/20/2023   Lab Results  Component Value Date   CRP 3.0 03/20/2023     Assessment and Plan  Deep tissue infection/acute on chronic osteo Continue on amox/clav for another 3 wk -- for a total of 6 wk since surgery to readdress if need to continue on chronic abtx  I have personally spent 20 minutes involved in face-to-face and non-face-to-face activities for this patient on the day of the visit. Professional time spent includes the following activities: Preparing to see the patient (review of tests), Obtaining and/or reviewing separately obtained history (admission/discharge record), Performing a medically appropriate examination and/or evaluation , Ordering medications/tests/procedures, referring and communicating with other health care professionals, Documenting clinical information in the EMR, Independently interpreting results (not separately  reported), Communicating results to the patient/family/caregiver, Counseling and educating the patient/family/caregiver and Care coordination (not separately reported).

## 2023-07-22 NOTE — Telephone Encounter (Signed)
[facility] unable to find anything in their system, they will check with Dr. Nilsa Nutting CMA, Morrie Sheldon.   Sandie Ano, RN

## 2023-08-08 DIAGNOSIS — Z1231 Encounter for screening mammogram for malignant neoplasm of breast: Secondary | ICD-10-CM | POA: Diagnosis not present

## 2023-08-11 ENCOUNTER — Ambulatory Visit: Payer: Medicare HMO | Admitting: Internal Medicine

## 2023-08-13 ENCOUNTER — Other Ambulatory Visit: Payer: Self-pay | Admitting: Pharmacy Technician

## 2023-08-27 ENCOUNTER — Ambulatory Visit: Payer: Medicare HMO | Admitting: Internal Medicine

## 2023-08-28 ENCOUNTER — Ambulatory Visit: Payer: Medicare HMO | Admitting: Internal Medicine

## 2023-12-31 DIAGNOSIS — I10 Essential (primary) hypertension: Secondary | ICD-10-CM | POA: Diagnosis not present

## 2023-12-31 DIAGNOSIS — E782 Mixed hyperlipidemia: Secondary | ICD-10-CM | POA: Diagnosis not present

## 2023-12-31 DIAGNOSIS — R7303 Prediabetes: Secondary | ICD-10-CM | POA: Diagnosis not present

## 2024-04-09 DIAGNOSIS — K080 Exfoliation of teeth due to systemic causes: Secondary | ICD-10-CM | POA: Diagnosis not present

## 2024-06-28 DIAGNOSIS — I10 Essential (primary) hypertension: Secondary | ICD-10-CM | POA: Diagnosis not present

## 2024-06-28 DIAGNOSIS — Z1331 Encounter for screening for depression: Secondary | ICD-10-CM | POA: Diagnosis not present

## 2024-06-28 DIAGNOSIS — Z0000 Encounter for general adult medical examination without abnormal findings: Secondary | ICD-10-CM | POA: Diagnosis not present

## 2024-06-28 DIAGNOSIS — R7303 Prediabetes: Secondary | ICD-10-CM | POA: Diagnosis not present

## 2024-06-28 DIAGNOSIS — E782 Mixed hyperlipidemia: Secondary | ICD-10-CM | POA: Diagnosis not present

## 2024-08-13 DIAGNOSIS — Z1231 Encounter for screening mammogram for malignant neoplasm of breast: Secondary | ICD-10-CM | POA: Diagnosis not present

## 2024-08-26 DIAGNOSIS — H01006 Unspecified blepharitis left eye, unspecified eyelid: Secondary | ICD-10-CM | POA: Diagnosis not present

## 2024-08-26 DIAGNOSIS — I10 Essential (primary) hypertension: Secondary | ICD-10-CM | POA: Diagnosis not present

## 2024-09-22 DIAGNOSIS — S71101D Unspecified open wound, right thigh, subsequent encounter: Secondary | ICD-10-CM | POA: Diagnosis not present

## 2024-10-11 DIAGNOSIS — K080 Exfoliation of teeth due to systemic causes: Secondary | ICD-10-CM | POA: Diagnosis not present

## 2024-10-12 DIAGNOSIS — K080 Exfoliation of teeth due to systemic causes: Secondary | ICD-10-CM | POA: Diagnosis not present
